# Patient Record
Sex: Female | Born: 1945 | Race: Black or African American | Hispanic: No | Marital: Single | Smoking: Never smoker
Health system: Southern US, Community
[De-identification: ages and names within clinical notes are randomized; demographics above are authoritative.]

## PROBLEM LIST (undated history)

## (undated) DIAGNOSIS — E785 Hyperlipidemia, unspecified: Secondary | ICD-10-CM

## (undated) DIAGNOSIS — R51 Headache: Secondary | ICD-10-CM

## (undated) DIAGNOSIS — I10 Essential (primary) hypertension: Secondary | ICD-10-CM

## (undated) DIAGNOSIS — R519 Headache, unspecified: Secondary | ICD-10-CM

## (undated) DIAGNOSIS — T7840XA Allergy, unspecified, initial encounter: Secondary | ICD-10-CM

## (undated) DIAGNOSIS — H3530 Unspecified macular degeneration: Secondary | ICD-10-CM

## (undated) DIAGNOSIS — K219 Gastro-esophageal reflux disease without esophagitis: Secondary | ICD-10-CM

## (undated) DIAGNOSIS — F329 Major depressive disorder, single episode, unspecified: Secondary | ICD-10-CM

## (undated) DIAGNOSIS — F419 Anxiety disorder, unspecified: Secondary | ICD-10-CM

## (undated) DIAGNOSIS — H269 Unspecified cataract: Secondary | ICD-10-CM

## (undated) DIAGNOSIS — M1990 Unspecified osteoarthritis, unspecified site: Secondary | ICD-10-CM

## (undated) DIAGNOSIS — F32A Depression, unspecified: Secondary | ICD-10-CM

## (undated) HISTORY — DX: Hyperlipidemia, unspecified: E78.5

## (undated) HISTORY — DX: Allergy, unspecified, initial encounter: T78.40XA

## (undated) HISTORY — DX: Unspecified cataract: H26.9

## (undated) HISTORY — PX: ABDOMINAL HYSTERECTOMY: SHX81

## (undated) HISTORY — PX: TUBAL LIGATION: SHX77

## (undated) HISTORY — PX: EYE SURGERY: SHX253

## (undated) HISTORY — DX: Anxiety disorder, unspecified: F41.9

## (undated) HISTORY — PX: KNEE ARTHROSCOPY: SUR90

## (undated) HISTORY — DX: Depression, unspecified: F32.A

## (undated) HISTORY — DX: Major depressive disorder, single episode, unspecified: F32.9

## (undated) HISTORY — PX: CHOLECYSTECTOMY: SHX55

---

## 1997-09-28 ENCOUNTER — Emergency Department (HOSPITAL_COMMUNITY): Admission: EM | Admit: 1997-09-28 | Discharge: 1997-09-28 | Payer: Self-pay

## 1997-12-18 ENCOUNTER — Emergency Department (HOSPITAL_COMMUNITY): Admission: EM | Admit: 1997-12-18 | Discharge: 1997-12-18 | Payer: Self-pay | Admitting: Emergency Medicine

## 1998-05-08 ENCOUNTER — Ambulatory Visit (HOSPITAL_COMMUNITY): Admission: RE | Admit: 1998-05-08 | Discharge: 1998-05-08 | Payer: Self-pay | Admitting: Cardiovascular Disease

## 1998-05-09 ENCOUNTER — Ambulatory Visit (HOSPITAL_COMMUNITY): Admission: RE | Admit: 1998-05-09 | Discharge: 1998-05-09 | Payer: Self-pay | Admitting: Cardiovascular Disease

## 1998-05-09 ENCOUNTER — Encounter: Payer: Self-pay | Admitting: Cardiovascular Disease

## 1998-12-21 ENCOUNTER — Emergency Department (HOSPITAL_COMMUNITY): Admission: EM | Admit: 1998-12-21 | Discharge: 1998-12-21 | Payer: Self-pay | Admitting: Emergency Medicine

## 1999-07-05 ENCOUNTER — Encounter: Admission: RE | Admit: 1999-07-05 | Discharge: 1999-07-05 | Payer: Self-pay | Admitting: Internal Medicine

## 1999-07-05 ENCOUNTER — Encounter: Payer: Self-pay | Admitting: Internal Medicine

## 2000-03-05 ENCOUNTER — Encounter: Payer: Self-pay | Admitting: Emergency Medicine

## 2000-03-05 ENCOUNTER — Emergency Department (HOSPITAL_COMMUNITY): Admission: EM | Admit: 2000-03-05 | Discharge: 2000-03-05 | Payer: Self-pay | Admitting: Emergency Medicine

## 2001-09-23 ENCOUNTER — Ambulatory Visit (HOSPITAL_BASED_OUTPATIENT_CLINIC_OR_DEPARTMENT_OTHER): Admission: RE | Admit: 2001-09-23 | Discharge: 2001-09-23 | Payer: Self-pay | Admitting: Orthopedic Surgery

## 2001-09-27 ENCOUNTER — Emergency Department (HOSPITAL_COMMUNITY): Admission: EM | Admit: 2001-09-27 | Discharge: 2001-09-27 | Payer: Self-pay | Admitting: Emergency Medicine

## 2002-07-28 ENCOUNTER — Encounter: Payer: Self-pay | Admitting: Occupational Medicine

## 2002-07-28 ENCOUNTER — Encounter: Admission: RE | Admit: 2002-07-28 | Discharge: 2002-07-28 | Payer: Self-pay | Admitting: Occupational Medicine

## 2002-08-12 ENCOUNTER — Encounter: Admission: RE | Admit: 2002-08-12 | Discharge: 2002-09-09 | Payer: Self-pay | Admitting: Occupational Medicine

## 2003-03-04 ENCOUNTER — Emergency Department (HOSPITAL_COMMUNITY): Admission: AD | Admit: 2003-03-04 | Discharge: 2003-03-04 | Payer: Self-pay | Admitting: Emergency Medicine

## 2003-07-10 ENCOUNTER — Emergency Department (HOSPITAL_COMMUNITY): Admission: AD | Admit: 2003-07-10 | Discharge: 2003-07-10 | Payer: Self-pay | Admitting: Family Medicine

## 2003-07-20 ENCOUNTER — Emergency Department (HOSPITAL_COMMUNITY): Admission: EM | Admit: 2003-07-20 | Discharge: 2003-07-20 | Payer: Self-pay | Admitting: Family Medicine

## 2004-07-23 ENCOUNTER — Ambulatory Visit: Payer: Self-pay | Admitting: Internal Medicine

## 2004-11-07 ENCOUNTER — Emergency Department (HOSPITAL_COMMUNITY): Admission: EM | Admit: 2004-11-07 | Discharge: 2004-11-07 | Payer: Self-pay | Admitting: Emergency Medicine

## 2004-11-29 ENCOUNTER — Ambulatory Visit: Payer: Self-pay | Admitting: Internal Medicine

## 2005-08-10 ENCOUNTER — Emergency Department (HOSPITAL_COMMUNITY): Admission: EM | Admit: 2005-08-10 | Discharge: 2005-08-11 | Payer: Self-pay | Admitting: Family Medicine

## 2005-08-15 ENCOUNTER — Ambulatory Visit: Payer: Self-pay | Admitting: Internal Medicine

## 2006-02-11 ENCOUNTER — Ambulatory Visit: Payer: Self-pay | Admitting: Internal Medicine

## 2007-01-30 ENCOUNTER — Encounter: Payer: Self-pay | Admitting: Internal Medicine

## 2007-11-27 ENCOUNTER — Ambulatory Visit: Payer: Self-pay | Admitting: Internal Medicine

## 2007-11-27 DIAGNOSIS — I10 Essential (primary) hypertension: Secondary | ICD-10-CM | POA: Insufficient documentation

## 2007-11-27 DIAGNOSIS — E119 Type 2 diabetes mellitus without complications: Secondary | ICD-10-CM

## 2007-11-27 DIAGNOSIS — F411 Generalized anxiety disorder: Secondary | ICD-10-CM | POA: Insufficient documentation

## 2007-11-27 LAB — CONVERTED CEMR LAB
AST: 14 units/L (ref 0–37)
Albumin: 4.3 g/dL (ref 3.5–5.2)
Alkaline Phosphatase: 72 units/L (ref 39–117)
Basophils Relative: 1 % (ref 0–1)
Bilirubin, Direct: <0.1 mg/dL (ref 0.0–0.3)
Blood Glucose, Fingerstick: 155
Calcium: 10 mg/dL (ref 8.4–10.5)
Eosinophils Absolute: 0.5 10*3/uL (ref 0.0–0.7)
Eosinophils Relative: 7 % — ABNORMAL HIGH (ref 0–5)
Glucose, Bld: 153 mg/dL — ABNORMAL HIGH (ref 70–99)
HCT: 40.3 % (ref 36.0–46.0)
Hemoglobin: 12.9 g/dL (ref 12.0–15.0)
Hgb A1c MFr Bld: 6.9 % — ABNORMAL HIGH (ref 4.6–6.1)
Lymphocytes Relative: 34 % (ref 12–46)
MCHC: 32 g/dL (ref 30.0–36.0)
Monocytes Absolute: 0.4 10*3/uL (ref 0.1–1.0)
Neutro Abs: 3.3 10*3/uL (ref 1.7–7.7)
RDW: 14.2 % (ref 11.5–15.5)
Sodium: 141 meq/L (ref 135–145)
Total Protein: 7.5 g/dL (ref 6.0–8.3)

## 2008-01-27 ENCOUNTER — Ambulatory Visit: Payer: Self-pay | Admitting: Internal Medicine

## 2008-01-27 DIAGNOSIS — R1310 Dysphagia, unspecified: Secondary | ICD-10-CM | POA: Insufficient documentation

## 2008-01-27 LAB — CONVERTED CEMR LAB: Blood Glucose, Fingerstick: 159

## 2008-05-02 ENCOUNTER — Ambulatory Visit: Payer: Self-pay | Admitting: Internal Medicine

## 2008-05-02 DIAGNOSIS — K219 Gastro-esophageal reflux disease without esophagitis: Secondary | ICD-10-CM | POA: Insufficient documentation

## 2008-05-02 LAB — CONVERTED CEMR LAB: Hgb A1c MFr Bld: 7.3 % — ABNORMAL HIGH (ref 4.6–6.0)

## 2008-09-21 ENCOUNTER — Encounter (INDEPENDENT_AMBULATORY_CARE_PROVIDER_SITE_OTHER): Payer: Self-pay | Admitting: *Deleted

## 2010-09-07 NOTE — Assessment & Plan Note (Signed)
[facility]                                 ON-CALL NOTE   NAME:Alison [name], Alison [name]                          MRN:          [id number]  DATE:12/13/2006                            DOB:          [birth year]    PHONE NUMBER:  [phone]   PRIMARY CARE PHYSICIAN:  Gordy Savers, MD   TIME OF CALL:  8:01 a.m.  on August 23, because of recurring vertigo.   She has had vertigo in the past and took Antivert by prescription and  also some other medication that she is not sure of and asked if I can  phone those in for her. She tells me she has no other symptoms  suggestive of stroke like weakness or problems with speech. She is only  just about 89, which makes stroke unlikely and she has had the vertigo  before. I told her that evaluation may be necessary if she is not  feeling better, but I did tell her that she can get the meclizine  without a prescription and advised that she try that. If she is not  getting better, I did tell her that we could see her in the office later  this morning.     Karie Schwalbe, MD  Electronically Signed    RIL/MedQ  DD: 12/13/2006  DT: 12/14/2006  Job #: [id number]   cc:   Gordy Savers, MD

## 2010-09-07 NOTE — Op Note (Signed)
[place]  Patient:    Alison [name], Alison [name] Visit Number: [id number] MRN: [id number]          Service Type: DSU Location: [facility] Attending Physician:  Colbert Ewing Dictated by:   Loreta Ave, M.D. Proc. 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  PROCEDURE:  Left knee examination under anesthesia, arthroscopy, with chondroplasty of the medial femoral condyle, partial medial meniscectomy, and excision of medial plica.  SURGEON:  Loreta Ave, M.D.  ASSISTANT:  Arlys John D. Petrarca, P.A.-C.  ANESTHESIA:  General.  ESTIMATED BLOOD LOSS:  Minimal.  TOURNIQUET TIME:  Not employed.  SPECIMENS:  None.  CULTURES:  None.  COMPLICATIONS:  None.  DRESSING:  Soft compressive.  DESCRIPTION OF PROCEDURE: The patient was brought to the operating room and placed in the supine position.  After adequate anesthesia had been obtained, left knee examined.  Full motion and good stability, reasonable patellofemoral tracking.  Tourniquet and leg holder applied, leg prepped and draped in the usual sterile fashion.  Three portals created, one superolateral, one each medial and lateral parapatellar.  Inflow catheter introduced, the knee distended, arthroscope introduced, knee inspected.  The patellofemoral joint was intact.  Reactive synovitis.  Good tracking.  Did not significantly tether.  Stout medial plica with abrasive changes on the condyle.  Plica resected, hemostasis with cautery.  Medial compartment revealed a detached posterior horn tear of medial meniscus, which was saucerized out, tapered in smoothly.  Flap tear, anterior horn,  saucerized out and tapered in.  Retained a fair amount of meniscus behind.  Diffuse grade 3 relatively superficial changes, weightbearing dome medial femoral condyle, treated with chondroplasty and removal of all flaps.  The plateau normal.  Little grade 2 changes lateral tibial plateau, debrided as well.  Lateral meniscus without tears.  Cruciate ligaments intact.  At completion, hemostasis was obtained with cautery.  The entire knee examined and no other significant findings appreciated. Instruments and fluid removed.  Portals and the knee injected with Marcaine. Portals closed with 4-0 nylon.  Sterile compressive dressing applied. Anesthesia reversed.  Brought to the recovery room.  Tolerated surgery well, no complications. Dictated by:   Loreta Ave, M.D. Attending Physician:  Colbert Ewing DD:  09/23/01 TD:  09/25/01 Job: [id number]

## 2011-06-29 ENCOUNTER — Observation Stay (HOSPITAL_COMMUNITY)
Admission: EM | Admit: 2011-06-29 | Discharge: 2011-06-29 | Disposition: A | Discharge status: Home / Self Care | Payer: Self-pay | Attending: Emergency Medicine | Admitting: Emergency Medicine

## 2011-06-29 ENCOUNTER — Emergency Department (INDEPENDENT_AMBULATORY_CARE_PROVIDER_SITE_OTHER)
Admission: EM | Admit: 2011-06-29 | Discharge: 2011-06-29 | Disposition: A | Discharge status: Nursing Home (Includes ICF) | Payer: Self-pay | Source: Home / Self Care | Attending: Emergency Medicine | Admitting: Emergency Medicine

## 2011-06-29 ENCOUNTER — Encounter (HOSPITAL_COMMUNITY): Payer: Self-pay

## 2011-06-29 DIAGNOSIS — Y92009 Unspecified place in unspecified non-institutional (private) residence as the place of occurrence of the external cause: Secondary | ICD-10-CM | POA: Insufficient documentation

## 2011-06-29 DIAGNOSIS — X58XXXA Exposure to other specified factors, initial encounter: Secondary | ICD-10-CM | POA: Insufficient documentation

## 2011-06-29 DIAGNOSIS — E876 Hypokalemia: Secondary | ICD-10-CM | POA: Insufficient documentation

## 2011-06-29 DIAGNOSIS — I10 Essential (primary) hypertension: Secondary | ICD-10-CM | POA: Insufficient documentation

## 2011-06-29 DIAGNOSIS — E119 Type 2 diabetes mellitus without complications: Secondary | ICD-10-CM | POA: Insufficient documentation

## 2011-06-29 DIAGNOSIS — Z79899 Other long term (current) drug therapy: Secondary | ICD-10-CM | POA: Insufficient documentation

## 2011-06-29 DIAGNOSIS — Z7982 Long term (current) use of aspirin: Secondary | ICD-10-CM | POA: Insufficient documentation

## 2011-06-29 DIAGNOSIS — R739 Hyperglycemia, unspecified: Secondary | ICD-10-CM

## 2011-06-29 DIAGNOSIS — Y998 Other external cause status: Secondary | ICD-10-CM | POA: Insufficient documentation

## 2011-06-29 DIAGNOSIS — T783XXA Angioneurotic edema, initial encounter: Secondary | ICD-10-CM

## 2011-06-29 HISTORY — DX: Essential (primary) hypertension: I10

## 2011-06-29 LAB — POCT I-STAT, CHEM 8
Chloride: 101 mEq/L (ref 96–112)
Creatinine, Ser: 0.7 mg/dL (ref 0.50–1.10)
Hemoglobin: 13.6 g/dL (ref 12.0–15.0)

## 2011-06-29 LAB — DIFFERENTIAL
Basophils Absolute: 0 10*3/uL (ref 0.0–0.1)
Basophils Relative: 0 % (ref 0–1)
Eosinophils Relative: 0 % (ref 0–5)
Lymphs Abs: 0.6 10*3/uL — ABNORMAL LOW (ref 0.7–4.0)
Monocytes Relative: 2 % — ABNORMAL LOW (ref 3–12)
Neutro Abs: 8.9 10*3/uL — ABNORMAL HIGH (ref 1.7–7.7)

## 2011-06-29 LAB — CBC: HCT: 38.4 % (ref 36.0–46.0)

## 2011-06-29 MED ORDER — EPINEPHRINE HCL 1 MG/ML IJ SOLN
INTRAMUSCULAR | Status: AC
  Filled 2011-06-29: qty 1

## 2011-06-29 MED ORDER — DIPHENHYDRAMINE HCL 50 MG/ML IJ SOLN
25.0000 mg | Freq: Once | INTRAMUSCULAR | Status: AC
  Administered 2011-06-29: 25 mg via INTRAVENOUS

## 2011-06-29 MED ORDER — PREDNISONE 20 MG PO TABS: 40.0000 mg | ORAL_TABLET | Freq: Every day | ORAL | Status: AC

## 2011-06-29 MED ORDER — POTASSIUM CHLORIDE CRYS ER 20 MEQ PO TBCR
40.0000 meq | EXTENDED_RELEASE_TABLET | Freq: Once | ORAL | Status: AC
  Administered 2011-06-29: 40 meq via ORAL
  Filled 2011-06-29: qty 2

## 2011-06-29 MED ORDER — POTASSIUM CHLORIDE 10 MEQ/100ML IV SOLN
10.0000 meq | Freq: Once | INTRAVENOUS | Status: AC
  Administered 2011-06-29: 10 meq via INTRAVENOUS
  Filled 2011-06-29: qty 100

## 2011-06-29 MED ORDER — EPINEPHRINE 0.3 MG/0.3ML IJ DEVI: 0.3000 mg | INTRAMUSCULAR | Status: DC | PRN

## 2011-06-29 MED ORDER — EPINEPHRINE HCL 1 MG/ML IJ SOLN
0.3000 mL | Freq: Once | INTRAMUSCULAR | Status: AC
  Administered 2011-06-29: 0.3 mg via INTRAMUSCULAR

## 2011-06-29 MED ORDER — DIPHENHYDRAMINE HCL 50 MG/ML IJ SOLN
INTRAMUSCULAR | Status: AC
  Filled 2011-06-29: qty 1

## 2011-06-29 MED ORDER — SODIUM CHLORIDE 0.9 % IV SOLN: Freq: Once | INTRAVENOUS | Status: DC

## 2011-06-29 MED ORDER — SODIUM CHLORIDE 0.9 % IV SOLN
INTRAVENOUS | Status: DC
  Administered 2011-06-29: 10:00:00 via INTRAVENOUS

## 2011-06-29 MED ORDER — METHYLPREDNISOLONE SODIUM SUCC 125 MG IJ SOLR
125.0000 mg | Freq: Once | INTRAMUSCULAR | Status: AC
  Administered 2011-06-29: 125 mg via INTRAVENOUS

## 2011-06-29 MED ORDER — METHYLPREDNISOLONE SODIUM SUCC 125 MG IJ SOLR
INTRAMUSCULAR | Status: AC
  Filled 2011-06-29: qty 2

## 2011-06-29 MED ORDER — FAMOTIDINE IN NACL 20-0.9 MG/50ML-% IV SOLN
20.0000 mg | Freq: Once | INTRAVENOUS | Status: AC
  Administered 2011-06-29: 20 mg via INTRAVENOUS
  Filled 2011-06-29: qty 50

## 2011-06-29 NOTE — ED Notes (Signed)
Pt presents to ED from [facility] via carelink, pt reports taking a BC powder last night and woke this am w/swelling to her tongue, pt denies taking any ace-inhibitors, VSS

## 2011-06-29 NOTE — ED Notes (Signed)
Dr. Ranae Palms at pt bedside a this time.

## 2011-06-29 NOTE — ED Notes (Signed)
Clear liquid diet ordered as per Grant Fontana, PA

## 2011-06-29 NOTE — ED Provider Notes (Signed)
1:44 PM Care assumed of the patient in the CDU from [name], PA-C. Patient on allergic reaction protocol; she apparently awoke with swelling to her tongue which started overnight while sleeping. Denies shortness of breath, difficulty breathing. Initially presented to urgent care and received Solu-Medrol, Benadryl, epinephrine en route. Has had slow subjective improvement. She's been moved to the CDU for further observation.   On exam, pt with swelling isolated to tongue. Pt speaking well and enunciating with very slightly muffled voice. Basic metabolic panel shows that her potassium is 2.7. As she still has swelling, do not feel that she is able to tolerate by mouth potassium at this time. Will replete via IV. Will continue to monitor.  3:18 PM Signout given to [place], PA-C who will disposition as appropriate.  Grant Fontana, [place] 06/29/11 [phone]

## 2011-06-29 NOTE — Discharge Instructions (Signed)
Please read and follow all provided instructions.  Your diagnoses today include:  1. Angioedema   2. Hyperglycemia     Tests performed today include:  Vital signs. See below for your results today.   Medications prescribed:   Benadryl (diphenhydramine) - antihistamine that blocks allergic reaction  Pepcid (famotidine) - antihistamine that blocks allergic reaction  You can find the antihistamines listed above over-the-counter. Take these for 3 days.  DO NOT exceed:   50mg  Benadryl every 6 hours  20mg  Pepcid every 12 hours   Benadryl will make you drowsy. DO NOT drive or perform any activities that require you to be awake and alert if taking this. Use Pepcid or Zyrtec if performing these activities.    Prednisone - steroid medicine that blocks allergic reaction  Please take the prednisone as prescribed for 5 days. It is best to take this medication in the morning to prevent sleeping problems. If you are diabetic, monitor your blood sugar closely and stop taking Prednisone if blood sugar is over 350. Take with food to prevent stomach upset.    Epi-pen - inject into thigh as directed if you have a severe reaction that causes throat swelling or any trouble breathing. Call 9-1-1 if you use an Epi-pen. You should be evaluated at a hospital as soon as possible.    Take any prescribed medications only as directed.  Home care instructions:   Follow any educational materials contained in this packet  Follow-up instructions: Please follow-up with your primary care provider in the next 3 days for further evaluation of your symptoms. If you do not have a primary care doctor -- see below for referral information.   Return instructions:   Please return to the Emergency Department if you experience worsening symptoms.   Call 9-1-1 immediately if you have an allergic reaction that involves your lips, mouth, throat or if you have any difficulty breathing. This is a life-threatening  emergency.   Please return if you have any other emergent concerns.  Additional Information:  Your vital signs today were: BP 124/109  Pulse 101  Temp(Src) 97.9 F (36.6 C) (Oral)  Resp 18  SpO2 98% If your blood pressure (BP) was elevated above 135/85 this visit, please have this repeated by your doctor within one month. -------------- No Primary Care Doctor Call [facility]  [phone] Other agencies that provide inexpensive medical care    [facility]  [phone]    [facility]  [phone]    [facility]  [phone]    [facility]  [phone]    [facility]  [phone]    [facility]  [phone] -------------- RESOURCE GUIDE:  Dental Problems  Patients with Medicaid: [facility] [phone] [address]                                            [phone] [address] Phone:  [phone]                                                   Phone:  [phone]  If unable to pay or uninsured, contact:  [facility] or [facility] to become qualified for the adult dental clinic.  Chronic Pain Problems Contact [facility]  [phone] Patients need to be referred by their primary care doctor.  Insufficient Money for Medicine Contact [employer]:  call "211" or [facility] [phone].  Psychological Services [facility] Behavioral [facility]  [phone] [facility]  [phone] [facility] Mental Health   [phone] (emergency services [phone])  Substance Abuse Resources [facility]  5390920253 [facility] [phone] [facility] [place] [phone] Floydene Flock [phone] Residential & Outpatient Substance Abuse Program  6310028703  Abuse/Neglect [facility] Child Abuse Hotline 5183715485 [facility] Child Abuse Hotline (313)[phone] (After Hours)  Emergency Shelter [facility]  Ministries [phone]  Maternity Homes Room at the [place] [facility] [phone] [place] [facility] [phone]  [facility] Resources  [facility]     [employer]                          [facility] Dept. [address] [place]                       [address]      [address]  Blondell Reveal Phone:  [phone]                                   Phone:  [phone]                 Phone:  [phone]  [facility] Mental Health Phone:  [phone]  [facility] Child Abuse Hotline [phone] 5158001995 (After Hours)

## 2011-06-29 NOTE — ED Provider Notes (Signed)
Chief Complaint  Patient presents with  . Oral Swelling    History of Present Illness:   Mrs. Pucillo workup this morning with swelling of her tongue, floor the mouth, and neck. This was slightly painful. She denies any difficulty breathing. She denies any swelling of her throat, wheezing, or cough. She took a BC powder last night going to bed and this was her only new medication. All of her other meds she's been on for months or longer. She's not taking an ACE inhibitor. She has had similar episodes in the past but not quite as severe. These were managed as an outpatient. She is allergic to codeine and penicillins, but does not think she is ingested either of these substances recently.  Review of Systems:  Other than noted above, the patient denies any of the following symptoms. Systemic:  No fever, chills, sweats, fatigue, myalgias, headache, or anorexia. Eye:  No redness, pain or drainage. ENT:  No earache, nasal congestion, rhinorrhea, sinus pressure, or sore throat. Lungs:  No cough, sputum production, wheezing, shortness of breath. Or chest pain. GI:  No nausea, vomiting, abdominal pain or diarrhea. Skin:  No rash or itching.  PMFSH:  Past medical history, family history, social history, meds, and allergies were reviewed.  Physical Exam:   Vital signs:  BP 151/84  Pulse 100  Temp(Src) 98.7 F (37.1 C) (Oral)  Resp 22  SpO2 100% General:  Alert, in no distress. She is in no respiratory distress. She does have some difficulty speaking due to the swelling of her tongue. Eye:  No conjunctival injection or drainage. ENT:  TMs and canals were normal, without erythema or inflammation.  Nasal mucosa was clear and uncongested, without drainage.  Mucous membranes were moist.  Pharynx was clear, without exudate or drainage.  There were no oral ulcerations or lesions. She has marked swelling of the tongue, floor the mouth, and the submandibular area of the neck. This was mildly tender to palpation.  The cervix itself is clear. Neck:  Supple, no adenopathy, tenderness or mass. Lungs:  No respiratory distress.  Lungs were clear to auscultation, without wheezes, rales or rhonchi.  Breath sounds were clear and equal bilaterally. Heart:  Regular rhythm, without gallops, murmers or rubs. Skin:  Clear, warm, and dry, without rash or lesions.  Course in Urgent Care Center:   An IV was started with normal saline at 50 mL per hour. She was given epinephrine 0.3 mL IM, Solu-Medrol 125 mg IV, and diphenhydramine 25 mg IV. During her stay here she did not develop any progressive respiratory distress and she was transferred by a care Link to the emergency department without any serious respiratory problems. She was given nasal oxygen at 2 L per minute.  Assessment:   Diagnoses that have been ruled out:  None  Diagnoses that are still under consideration:  None  Final diagnoses:  Angioedema      Plan:   1.  The patient was transferred by care Link to the emergency department.  Reuben Likes, MD 06/29/11 [phone]

## 2011-06-29 NOTE — ED Provider Notes (Signed)
History     CSN: [id number]  Arrival date & time 06/29/11  1040   First MD Initiated Contact with Patient 06/29/11 1045      Chief Complaint  Patient presents with  . Allergic Reaction    (Consider location/radiation/quality/duration/timing/severity/associated sxs/prior treatment) Patient is a 66 y.o. female presenting with allergic reaction. The history is provided by the patient and the EMS personnel.  Allergic Reaction The primary symptoms are  angioedema. The primary symptoms do not include shortness of breath or rash. The current episode started 6 to 12 hours ago.  The angioedema is not associated with shortness of breath.  Associated symptoms comments: Swelling isolated to tongue that started overnight while sleeping. She reports similar symptoms one month ago that was treated with steroid taper and resolved. No SOB, difficulty breathing. She received Solumedrol, Benadryl and EpiPen in route by EMS and reports she feels symptoms are improving..    Past Medical History  Diagnosis Date  . Hypertension   . Diabetes mellitus     Past Surgical History  Procedure Date  . Abdominal hysterectomy   . Cholecystectomy     No family history on file.  History  Substance Use Topics  . Smoking status: Never Smoker   . Smokeless tobacco: Not on file  . Alcohol Use: No    OB History    Grav Para Term Preterm Abortions TAB SAB Ect Mult Living                  Review of Systems  Constitutional: Negative for fever and chills.  HENT:       Tongue swelling.  Respiratory: Negative.  Negative for shortness of breath.   Cardiovascular: Negative.   Gastrointestinal: Negative.   Musculoskeletal: Negative.   Skin: Negative.  Negative for rash.  Neurological: Negative.     Allergies  Codeine and Penicillins  Home Medications   Current Outpatient Rx  Name Route Sig Dispense Refill  . METFORMIN HCL 1000 MG PO TABS Oral Take 1,000 mg by mouth 2 (two) times daily with a meal.     . METOPROLOL SUCCINATE ER 25 MG PO TB24 Oral Take 25 mg by mouth daily.    Marland Kitchen OVER THE COUNTER MEDICATION  bc powder    . PAROXETINE HCL 20 MG PO TABS Oral Take 20 mg by mouth every morning.    . TRIAMTERENE-HCTZ 37.5-25 MG PO TABS Oral Take 1 tablet by mouth daily.      BP 166/64  Pulse 101  Temp(Src) 98.2 F (36.8 C) (Oral)  Resp 20  SpO2 100%  Physical Exam  Constitutional: She appears well-developed and well-nourished.  HENT:  Head: Normocephalic.       Tongue moderately swollen. Oropharynx benign. No thyromegaly.  Neck: Normal range of motion. Neck supple.  Cardiovascular: Normal rate and regular rhythm.   Pulmonary/Chest: Effort normal and breath sounds normal. No respiratory distress. She has no wheezes.       No stridor.  Abdominal: Soft. Bowel sounds are normal. There is no tenderness. There is no rebound and no guarding.  Musculoskeletal: Normal range of motion.  Neurological: She is alert. No cranial nerve deficit.  Skin: Skin is warm and dry. No rash noted.  Psychiatric: She has a normal mood and affect.    ED Course  Procedures (including critical care time)  Labs Reviewed - No data to display No results found. Patient continues to improve, slowly, over time.   No diagnosis found.    MDM  Patient moved to CDU on Allergic reaction protocol for observation over longer period of time. Anticipate discharge home as she continues to feel better with very slow objective improvement.   Rodena Medin, PA-C 06/29/11 1311

## 2011-06-29 NOTE — ED Notes (Signed)
Rhonda at [facility] has a truck on the way

## 2011-06-29 NOTE — ED Provider Notes (Signed)
History     CSN: [id number]  Arrival date & time 06/29/11  1040   First MD Initiated Contact with Patient 06/29/11 1045      Chief Complaint  Patient presents with  . Allergic Reaction    (Consider location/radiation/quality/duration/timing/severity/associated sxs/prior treatment) HPI  Past Medical History  Diagnosis Date  . Hypertension   . Diabetes mellitus     Past Surgical History  Procedure Date  . Abdominal hysterectomy   . Cholecystectomy     No family history on file.  History  Substance Use Topics  . Smoking status: Never Smoker   . Smokeless tobacco: Not on file  . Alcohol Use: No    OB History    Grav Para Term Preterm Abortions TAB SAB Ect Mult Living                  Review of Systems  Allergies  Codeine and Penicillins  Home Medications   Current Outpatient Rx  Name Route Sig Dispense Refill  . ASPIRIN EC 81 MG PO TBEC Oral Take 81 mg by mouth daily.    Marland Kitchen METFORMIN HCL 1000 MG PO TABS Oral Take 1,000 mg by mouth 2 (two) times daily with a meal.    . METOPROLOL SUCCINATE ER 25 MG PO TB24 Oral Take 25 mg by mouth daily.    Marland Kitchen OVER THE COUNTER MEDICATION  bc powder    . PAROXETINE HCL 20 MG PO TABS Oral Take 20 mg by mouth every morning.    . TRIAMTERENE-HCTZ 37.5-25 MG PO TABS Oral Take 1 tablet by mouth daily.      BP 124/71  Pulse 105  Temp(Src) 97.9 F (36.6 C) (Oral)  Resp 18  SpO2 99%  Physical Exam  ED Course  Procedures (including critical care time)  Labs Reviewed  DIFFERENTIAL - Abnormal; Notable for the following:    Neutrophils Relative 92 (*)    Neutro Abs 8.9 (*)    Lymphocytes Relative 6 (*)    Lymphs Abs 0.6 (*)    Monocytes Relative 2 (*)    All other components within normal limits  GLUCOSE, CAPILLARY - Abnormal; Notable for the following:    Glucose-Capillary 212 (*)    All other components within normal limits  CBC   No results found.   1. Angioedema   2. Hyperglycemia     3:31 PM Patient with  angioedema that started this morning. Patient was seen at urgent care and given epinephrine at approximately 10 AM. She is currently in the CDU on the allergic reaction protocol after receiving steroids, Benadryl, and famotidine. She had a similar reaction last month that was treated with steroids. She is not currently on any ACE inhibitors.  Patient was seen and examined. She states subjective improvement in the swelling of her tongue. She is eating in room. Hypokalemia noted on exam and patient has received potassium. Will give additional by mouth potassium.  Plan: Will reexamined at 1630. At that point the patient will have received epinephrine 6-1/2 hours prior. If continuing to improve or stable will discharge to home. If worsening will continue to monitor.  3:34 PM Exam:  Gen NAD; Mouth trace edema of tongue, no uvula swelling; Heart RRR, nml S1,S2, no m/r/g; Lungs CTAB, no stridor or increased work of breathing; Abd soft, NT, no rebound or guarding; Ext 2+ pedal pulses bilaterally, no edema.  4:33 PM patient continues to have improvement. She has taken of potassium without difficulty. Will discharge to  home on prednisone. Patient is a diabetic. Urged patient to monitor her blood sugars closely and not to take prednisone if blood sugar is over 350. Also will prescribe epinephrine pen and I instructed the patient on its use. Urged primary care followup in the next week to discuss possible causes of allergic reaction. Urged patient to return immediately if her tongue, mouth swelling worsens or if she has shortness of breath. Patient verbalizes understanding and agrees with plan.  Patient was discussed with Dr. Ranae Palms prior to discharge.   MDM  Angioedema, clinically improved in emergency department. Patient has been monitored for greater than 4 hours. No concern for biphasic component to allergic reaction. Patient appears well, will discharge home.        Renne Crigler, [place] 06/29/11  1640

## 2011-06-29 NOTE — ED Notes (Signed)
Pt brought directly to room 2, swelling of neck and tongue, started last pm and has gotten worse, denies new meds or foods, denies sob.  Pt unable to talk clearly.

## 2011-06-29 NOTE — ED Notes (Signed)
Pt received Solumedrol 125 mg, Benadryl 25 mg, and Epinephrine 0.3 mg while at [facility]

## 2011-06-29 NOTE — Discharge Instructions (Signed)
We have determined that your problem requires further evaluation in the emergency department.  We will take care of your transport there.  Once at the emergency department, you will be evaluated by a provider and they will order whatever treatment or tests they deem necessary.  We cannot guarantee that they will do any specific test or do any specific treatment.  ° °

## 2011-06-30 NOTE — ED Provider Notes (Signed)
Medical screening examination/treatment/procedure(s) were conducted as a shared visit with non-physician practitioner(s) and myself.  I personally evaluated the patient during the encounter   Loren Racer, MD 06/30/11 1534

## 2011-06-30 NOTE — ED Provider Notes (Signed)
Medical screening examination/treatment/procedure(s) were conducted as a shared visit with non-physician practitioner(s) and myself.  I personally evaluated the patient during the encounter   Loren Racer, MD 06/30/11 1537

## 2011-06-30 NOTE — ED Provider Notes (Signed)
Medical screening examination/treatment/procedure(s) were performed by non-physician practitioner and as supervising physician I was immediately available for consultation/collaboration.   Dione Booze, MD 06/30/11 [phone]

## 2012-01-31 ENCOUNTER — Emergency Department (HOSPITAL_COMMUNITY)
Admission: EM | Admit: 2012-01-31 | Discharge: 2012-01-31 | Disposition: A | Discharge status: Home / Self Care | Payer: Medicare Other | Attending: Emergency Medicine | Admitting: Emergency Medicine

## 2012-01-31 ENCOUNTER — Emergency Department (INDEPENDENT_AMBULATORY_CARE_PROVIDER_SITE_OTHER)
Admission: EM | Admit: 2012-01-31 | Discharge: 2012-01-31 | Disposition: A | Discharge status: Other Acute Inpatient Hospital | Payer: Medicare Other | Source: Home / Self Care | Attending: Family Medicine | Admitting: Family Medicine

## 2012-01-31 ENCOUNTER — Encounter (HOSPITAL_COMMUNITY): Payer: Self-pay | Admitting: Emergency Medicine

## 2012-01-31 ENCOUNTER — Encounter (HOSPITAL_COMMUNITY): Payer: Self-pay

## 2012-01-31 DIAGNOSIS — Z79899 Other long term (current) drug therapy: Secondary | ICD-10-CM | POA: Insufficient documentation

## 2012-01-31 DIAGNOSIS — T783XXA Angioneurotic edema, initial encounter: Secondary | ICD-10-CM

## 2012-01-31 DIAGNOSIS — X58XXXA Exposure to other specified factors, initial encounter: Secondary | ICD-10-CM | POA: Insufficient documentation

## 2012-01-31 DIAGNOSIS — I10 Essential (primary) hypertension: Secondary | ICD-10-CM | POA: Insufficient documentation

## 2012-01-31 DIAGNOSIS — Z7982 Long term (current) use of aspirin: Secondary | ICD-10-CM | POA: Insufficient documentation

## 2012-01-31 DIAGNOSIS — E119 Type 2 diabetes mellitus without complications: Secondary | ICD-10-CM | POA: Insufficient documentation

## 2012-01-31 MED ORDER — DIPHENHYDRAMINE HCL 50 MG/ML IJ SOLN
INTRAMUSCULAR | Status: AC
  Filled 2012-01-31: qty 1

## 2012-01-31 MED ORDER — SODIUM CHLORIDE 0.9 % IV SOLN
Freq: Once | INTRAVENOUS | Status: AC
  Administered 2012-01-31: 11:00:00 via INTRAVENOUS

## 2012-01-31 MED ORDER — METOPROLOL TARTRATE 25 MG PO TABS
25.0000 mg | ORAL_TABLET | Freq: Once | ORAL | Status: AC
  Administered 2012-01-31: 25 mg via ORAL
  Filled 2012-01-31: qty 1

## 2012-01-31 MED ORDER — FAMOTIDINE IN NACL 20-0.9 MG/50ML-% IV SOLN
20.0000 mg | Freq: Once | INTRAVENOUS | Status: AC
  Administered 2012-01-31: 20 mg via INTRAVENOUS
  Filled 2012-01-31: qty 50

## 2012-01-31 MED ORDER — ASPIRIN 81 MG PO CHEW
81.0000 mg | CHEWABLE_TABLET | Freq: Once | ORAL | Status: AC
  Administered 2012-01-31: 81 mg via ORAL
  Filled 2012-01-31: qty 1

## 2012-01-31 MED ORDER — METHYLPREDNISOLONE SODIUM SUCC 125 MG IJ SOLR
125.0000 mg | Freq: Once | INTRAMUSCULAR | Status: AC
  Administered 2012-01-31: 125 mg via INTRAMUSCULAR

## 2012-01-31 MED ORDER — DIPHENHYDRAMINE HCL 50 MG/ML IJ SOLN
25.0000 mg | Freq: Once | INTRAMUSCULAR | Status: AC
  Administered 2012-01-31: 25 mg via INTRAVENOUS
  Filled 2012-01-31: qty 1

## 2012-01-31 MED ORDER — PREDNISONE (PAK) 10 MG PO TABS: 10.0000 mg | ORAL_TABLET | Freq: Every day | ORAL | Status: DC

## 2012-01-31 MED ORDER — DIPHENHYDRAMINE HCL 50 MG/ML IJ SOLN
50.0000 mg | Freq: Once | INTRAMUSCULAR | Status: AC
  Administered 2012-01-31: 50 mg via INTRAMUSCULAR

## 2012-01-31 MED ORDER — PAROXETINE HCL 20 MG PO TABS
20.0000 mg | ORAL_TABLET | Freq: Every day | ORAL | Status: DC
  Administered 2012-01-31: 20 mg via ORAL
  Filled 2012-01-31 (×2): qty 1

## 2012-01-31 MED ORDER — DIPHENHYDRAMINE HCL 25 MG PO TABS: 25.0000 mg | ORAL_TABLET | Freq: Four times a day (QID) | ORAL | Status: DC

## 2012-01-31 MED ORDER — METFORMIN HCL 500 MG PO TABS
500.0000 mg | ORAL_TABLET | Freq: Once | ORAL | Status: AC
  Administered 2012-01-31: 500 mg via ORAL
  Filled 2012-01-31: qty 1

## 2012-01-31 MED ORDER — EPINEPHRINE HCL 1 MG/ML IJ SOLN
INTRAMUSCULAR | Status: AC
  Filled 2012-01-31: qty 1

## 2012-01-31 MED ORDER — METHYLPREDNISOLONE SODIUM SUCC 125 MG IJ SOLR
INTRAMUSCULAR | Status: AC
  Filled 2012-01-31: qty 2

## 2012-01-31 MED ORDER — FAMOTIDINE 20 MG PO TABS: 20.0000 mg | ORAL_TABLET | Freq: Two times a day (BID) | ORAL | Status: DC

## 2012-01-31 MED ORDER — EPINEPHRINE HCL 1 MG/ML IJ SOLN
0.3000 mL | Freq: Once | INTRAMUSCULAR | Status: AC
  Administered 2012-01-31: 0.3 mg via INTRAMUSCULAR

## 2012-01-31 NOTE — ED Provider Notes (Signed)
Patient care assumed from [facility], PA-C. Patient being monitored for complaint of angioedema. Patient reassessed and states that she feels better. Airway not compromised, uvula midline, and lungs CTA. Patient will continue to be monitored. Patient care signed out to [facility], [place].  Pixie Casino, PA-C 01/31/12 1545

## 2012-01-31 NOTE — ED Provider Notes (Signed)
Medical screening examination/treatment/procedure(s) were conducted as a shared visit with non-physician practitioner(s) and myself.  I personally evaluated the patient during the encounter   Pt seen at urgent care, sent here for allergic angioedema presumably.  Pt improving here after further benadryl.  Placed on CDU allergic reaction protocol.  If continues to improve, safe to discharge to home with PCP follow up.  streoid Rx.  Gavin Pound. Oletta Lamas, MD 01/31/12 1191

## 2012-01-31 NOTE — ED Notes (Signed)
Pt discharged.Vital signs stable and GCS 15 

## 2012-01-31 NOTE — ED Provider Notes (Signed)
Medical screening examination/treatment/procedure(s) were conducted as a shared visit with non-physician practitioner(s) and myself.  I personally evaluated the patient during the encounter    Alison [name]. Alison Lamas, MD 01/31/12 4098

## 2012-01-31 NOTE — ED Notes (Signed)
EMS called to transport pt, ED advised  @ 11:25; EMS arrived 11:30

## 2012-01-31 NOTE — ED Notes (Signed)
Pt transported to CDU 3, reports swelling to tongue is "better", pt is able to swallow without difficulty, Dr. Oletta Lamas at bedside.

## 2012-01-31 NOTE — ED Notes (Signed)
Diabetic meal tray ordered for pt at this time.

## 2012-01-31 NOTE — ED Provider Notes (Signed)
History     CSN: [id number]  Arrival date & time 01/31/12  1147   First MD Initiated Contact with Patient 01/31/12 1213      Chief Complaint  Patient presents with  . Allergic Reaction    (Consider location/radiation/quality/duration/timing/severity/associated sxs/prior treatment) HPI Patient presents emergency department with angioedema of her tongue.  The patient, states, that she started having this problem earlier this morning and she felt that her tongue was swollen and she was having difficulty speaking.  The patient, states, that she did not eat anything new or different today.  She denies any new exposures to chemicals or medication.  Patient has chest pain, shortness of breath, nausea, vomiting, headache, back pain, blurred vision, syncope, or wheezing.  Patient, states she did not take anything prior to arrival, for the symptoms.  Patient was seen at the urgent care center and given epinephrine and Decadron and Benadryl.  Past Medical History  Diagnosis Date  . Hypertension   . Diabetes mellitus     Past Surgical History  Procedure Date  . Abdominal hysterectomy   . Cholecystectomy     History reviewed. No pertinent family history.  History  Substance Use Topics  . Smoking status: Never Smoker   . Smokeless tobacco: Not on file  . Alcohol Use: No    OB History    Grav Para Term Preterm Abortions TAB SAB Ect Mult Living                  Review of Systems All other systems negative except as documented in the HPI. All pertinent positives and negatives as reviewed in the HPI.  Allergies  Codeine and Penicillins  Home Medications   Current Outpatient Rx  Name Route Sig Dispense Refill  . ASPIRIN EC 81 MG PO TBEC Oral Take 81 mg by mouth daily.    . BC HEADACHE POWDER PO Oral Take 1 packet by mouth 2 (two) times daily as needed. For headache    . METFORMIN HCL 1000 MG PO TABS Oral Take 500 mg by mouth 2 (two) times daily with a meal.     . METOPROLOL  TARTRATE 25 MG PO TABS Oral Take 25 mg by mouth 2 (two) times daily.    Marland Kitchen PAROXETINE HCL 20 MG PO TABS Oral Take 20 mg by mouth every morning.    . TRIAMTERENE-HCTZ 37.5-25 MG PO TABS Oral Take 1 tablet by mouth daily.    Marland Kitchen EPINEPHRINE 0.3 MG/0.3ML IJ DEVI Intramuscular Inject 0.3 mLs (0.3 mg total) into the muscle as needed. 1 Device 0    BP 141/73  Pulse 94  Temp 98.7 F (37.1 C) (Oral)  Resp 18  SpO2 97%  Physical Exam  Nursing note and vitals reviewed. Constitutional: She is oriented to person, place, and time. She appears well-developed and well-nourished. No distress.  HENT:  Head: Normocephalic and atraumatic.  Mouth/Throat: Uvula is midline, oropharynx is clear and moist and mucous membranes are normal. No uvula swelling.    Eyes: Pupils are equal, round, and reactive to light.  Neck: Normal range of motion. Neck supple.  Cardiovascular: Normal rate, regular rhythm and normal heart sounds.   Pulmonary/Chest: Effort normal and breath sounds normal. No stridor. No respiratory distress. She has no wheezes.  Neurological: She is alert and oriented to person, place, and time.  Skin: Skin is warm and dry.    ED Course  Procedures (including critical care time)  2:05PM- The patient is vastly improving and feeling  better. Will move to the CDU for further observation.   MDM          Carlyle Dolly, PA-C 01/31/12 1527

## 2012-01-31 NOTE — ED Notes (Signed)
Pt presents to ed via ems from urgent care with c/o allergic reaction to unknown substance. Pt reported facial, tongue, hands and neck swelling. Pt reports this happened a few months ago.

## 2012-01-31 NOTE — ED Notes (Signed)
Pt was offered sips of water, swallowed w difficulty

## 2012-01-31 NOTE — ED Provider Notes (Signed)
3:31 PM Patient is in CDU under observation for angioedema.  Sign out received from Alison [name], [place].  Patient with angioedema of the tongue, treated at [facility] urgent care with epinephrine, decadron, and benadryl (around 11am per chart).  Plan is for continued monitoring and d/c home with steroids, benadryl, pepcid.  Patient reports she is starting to feel better.  States she has now eaten and is swallowing and breathing without difficulty.  Does note that she has to take her time to eat as she continues to have swelling.  Discussed with Ebbie Ridge, PA-C, who states he would like patient to stay for another 2-3 hours.  7:17 PM Patient continues to feel well.  States her tongue is nearly back to normal.  Has eaten.  Continued improvement.  Would like to go home.  On exam, tongue appears much improved.  No swelling noted.  Pharynx without edema.  No airway concerns.  Pt tolerating PO easily.  Pt states she has epi pen at home.  Will d/c home with benadryl, pepcid, prednisone, allergy follow up. Pt given return precautions.  Pt verbalizes understanding and agrees with plan.     Results for orders placed during the hospital encounter of 06/29/11  CBC      Component Value Range   WBC 9.7  4.0 - 10.5 K/uL   RBC 4.51  3.87 - 5.11 MIL/uL   Hemoglobin 13.0  12.0 - 15.0 g/dL   HCT 16.1  09.6 - 04.5 %   MCV 85.1  78.0 - 100.0 fL   MCH 28.8  26.0 - 34.0 pg   MCHC 33.9  30.0 - 36.0 g/dL   RDW 40.9  81.1 - 91.4 %   Platelets 281  150 - 400 K/uL  DIFFERENTIAL      Component Value Range   Neutrophils Relative 92 (*) 43 - 77 %   Neutro Abs 8.9 (*) 1.7 - 7.7 K/uL   Lymphocytes Relative 6 (*) 12 - 46 %   Lymphs Abs 0.6 (*) 0.7 - 4.0 K/uL   Monocytes Relative 2 (*) 3 - 12 %   Monocytes Absolute 0.2  0.1 - 1.0 K/uL   Eosinophils Relative 0  0 - 5 %   Eosinophils Absolute 0.0  0.0 - 0.7 K/uL   Basophils Relative 0  0 - 1 %   Basophils Absolute 0.0  0.0 - 0.1 K/uL  GLUCOSE, CAPILLARY      Component Value Range     Glucose-Capillary 212 (*) 70 - 99 mg/dL   No results found.    Stark, [place] 01/31/12 2147

## 2012-01-31 NOTE — ED Notes (Signed)
States she was okay last PM, when she woke this am, tongue felt odd, went to an appointment, and when time to leave, she noted her tongue was swollen, now getting worse. Tongue swollen, protruding, submandibular area swelling noted . Able to handle her own secretions

## 2012-01-31 NOTE — ED Provider Notes (Addendum)
History     CSN: [id number]  Arrival date & time 01/31/12  1026   First MD Initiated Contact with Patient 01/31/12 1039      Chief Complaint  Patient presents with  . Angioedema    (Consider location/radiation/quality/duration/timing/severity/associated sxs/prior treatment) Patient is a 66 y.o. female presenting with mouth sores. The history is provided by the patient.  Mouth Lesions  The current episode started today. The onset was sudden. The problem has been gradually worsening. The problem is moderate. Pertinent negatives include no mouth sores, no stridor, no neck pain, no cough and no wheezing. Associated symptoms comments: Tongue swelling, acutely this am, no stridor  Or wheezing., no fever, no new meds or food..    Past Medical History  Diagnosis Date  . Hypertension   . Diabetes mellitus     Past Surgical History  Procedure Date  . Abdominal hysterectomy   . Cholecystectomy     History reviewed. No pertinent family history.  History  Substance Use Topics  . Smoking status: Never Smoker   . Smokeless tobacco: Not on file  . Alcohol Use: No    OB History    Grav Para Term Preterm Abortions TAB SAB Ect Mult Living                  Review of Systems  Constitutional: Negative.   HENT: Positive for trouble swallowing and voice change. Negative for mouth sores and neck pain.   Respiratory: Negative for cough, choking, shortness of breath, wheezing and stridor.   Cardiovascular: Negative.     Allergies  Codeine and Penicillins  Home Medications   Current Outpatient Rx  Name Route Sig Dispense Refill  . ASPIRIN EC 81 MG PO TBEC Oral Take 81 mg by mouth daily.    Marland Kitchen EPINEPHRINE 0.3 MG/0.3ML IJ DEVI Intramuscular Inject 0.3 mLs (0.3 mg total) into the muscle as needed. 1 Device 0  . METFORMIN HCL 1000 MG PO TABS Oral Take 1,000 mg by mouth 2 (two) times daily with a meal.    . METOPROLOL SUCCINATE ER 25 MG PO TB24 Oral Take 25 mg by mouth daily.    Marland Kitchen  OVER THE COUNTER MEDICATION  bc powder    . PAROXETINE HCL 20 MG PO TABS Oral Take 20 mg by mouth every morning.    . TRIAMTERENE-HCTZ 37.5-25 MG PO TABS Oral Take 1 tablet by mouth daily.      BP 186/112  Pulse 90  Temp 98.7 F (37.1 C) (Oral)  Resp 20  SpO2 99%  Physical Exam  Nursing note and vitals reviewed. Constitutional: She is oriented to person, place, and time. She appears well-developed and well-nourished. No distress.  HENT:  Head: Normocephalic.  Right Ear: External ear normal.  Left Ear: External ear normal.  Nose: Nose normal.  Mouth/Throat: No oral lesions. No uvula swelling. Posterior oropharyngeal edema present. No posterior oropharyngeal erythema.    Neck: Normal range of motion. Neck supple.  Cardiovascular: Normal rate, normal heart sounds and intact distal pulses.   Pulmonary/Chest: Breath sounds normal. No stridor. No respiratory distress. She has no wheezes.  Neurological: She is alert and oriented to person, place, and time.  Skin: Skin is warm and dry.    ED Course  Procedures (including critical care time)  Labs Reviewed - No data to display No results found.   1. Idiopathic angioedema       MDM  Pt was stable at time of transfer.  Linna Hoff, MD 01/31/12 1118  Linna Hoff, MD 01/31/12 [phone]

## 2012-02-02 NOTE — ED Provider Notes (Signed)
Medical screening examination/treatment/procedure(s) were conducted as a shared visit with non-physician practitioner(s) and myself.  I personally evaluated the patient during the encounter   Please see my original co-signed note as well as PAC Lawyer's notes for initial eval, plan .  Gavin Pound. Khira Cudmore, MD 02/02/12 1651

## 2012-07-01 ENCOUNTER — Emergency Department (HOSPITAL_COMMUNITY)
Admission: EM | Admit: 2012-07-01 | Discharge: 2012-07-01 | Disposition: A | Discharge status: Home / Self Care | Payer: Medicare Other | Attending: Emergency Medicine | Admitting: Emergency Medicine

## 2012-07-01 ENCOUNTER — Encounter (HOSPITAL_COMMUNITY): Payer: Self-pay | Admitting: Emergency Medicine

## 2012-07-01 DIAGNOSIS — T783XXA Angioneurotic edema, initial encounter: Secondary | ICD-10-CM | POA: Insufficient documentation

## 2012-07-01 DIAGNOSIS — Z79899 Other long term (current) drug therapy: Secondary | ICD-10-CM | POA: Insufficient documentation

## 2012-07-01 DIAGNOSIS — E119 Type 2 diabetes mellitus without complications: Secondary | ICD-10-CM | POA: Insufficient documentation

## 2012-07-01 DIAGNOSIS — I10 Essential (primary) hypertension: Secondary | ICD-10-CM | POA: Insufficient documentation

## 2012-07-01 DIAGNOSIS — Z7982 Long term (current) use of aspirin: Secondary | ICD-10-CM | POA: Insufficient documentation

## 2012-07-01 MED ORDER — EPINEPHRINE 0.3 MG/0.3ML IJ DEVI
0.3000 mg | Freq: Once | INTRAMUSCULAR | Status: AC
  Administered 2012-07-01: 0.3 mg via INTRAMUSCULAR

## 2012-07-01 MED ORDER — FAMOTIDINE 20 MG PO TABS: 20.0000 mg | ORAL_TABLET | Freq: Two times a day (BID) | ORAL | Status: DC

## 2012-07-01 MED ORDER — PREDNISONE 20 MG PO TABS: 60.0000 mg | ORAL_TABLET | Freq: Every day | ORAL | Status: DC

## 2012-07-01 MED ORDER — FAMOTIDINE IN NACL 20-0.9 MG/50ML-% IV SOLN
20.0000 mg | Freq: Once | INTRAVENOUS | Status: AC
  Administered 2012-07-01: 20 mg via INTRAVENOUS
  Filled 2012-07-01: qty 50

## 2012-07-01 MED ORDER — DIPHENHYDRAMINE HCL 50 MG/ML IJ SOLN
25.0000 mg | Freq: Once | INTRAMUSCULAR | Status: AC
  Administered 2012-07-01: 25 mg via INTRAVENOUS
  Filled 2012-07-01: qty 1

## 2012-07-01 MED ORDER — DIPHENHYDRAMINE HCL 25 MG PO TABS: 25.0000 mg | ORAL_TABLET | Freq: Four times a day (QID) | ORAL | Status: AC

## 2012-07-01 MED ORDER — METHYLPREDNISOLONE SODIUM SUCC 125 MG IJ SOLR
125.0000 mg | Freq: Once | INTRAMUSCULAR | Status: AC
  Administered 2012-07-01: 125 mg via INTRAVENOUS
  Filled 2012-07-01: qty 2

## 2012-07-01 MED ORDER — EPINEPHRINE 0.3 MG/0.3ML IJ DEVI: 0.3000 mg | Freq: Once | INTRAMUSCULAR | Status: DC

## 2012-07-01 MED ORDER — EPINEPHRINE 0.3 MG/0.3ML IJ DEVI
INTRAMUSCULAR | Status: AC
  Filled 2012-07-01: qty 0.3

## 2012-07-01 NOTE — ED Notes (Addendum)
Pt able to stick out tongue,states she feels less swollen.

## 2012-07-01 NOTE — Progress Notes (Signed)
9:37 AM Pt had been seen by Dr. Norlene Campbell for angioedema.  She has been observed for six hours and her swelling is gradually improving.  I reviewed Dr. Sharman Cheek prescriptions for EpiPen, Prednisone, Pepcid, and Benadryl, and her advice for pt to seek followup with [facility].  Released.

## 2012-07-01 NOTE — ED Provider Notes (Signed)
History     CSN: [id number]  Arrival date & time 07/01/12  0343   First MD Initiated Contact with Patient 07/01/12 [phone]      Chief Complaint  Patient presents with  . Oral Swelling    (Consider location/radiation/quality/duration/timing/severity/associated sxs/prior treatment) HPI 67 yo female presents to the ER with complaint of tongue swelling.  Sxs started about 4 hours ago.  Initially just the right side of the tongue, but has continued to progress.  Pt is able to breathe fine, but is having difficulty swallowing.  She c/o pain to the base of her tongue and swelling to her submandibular space.  Pt had tooth pulled 2 weeks ago, but denies any fever, dental pain, drainage.  Pt has had this occur several times before, always improved with allergy treatments.  Pt is not on ACE or ARB.  No cause has been found yet.  Past Medical History  Diagnosis Date  . Hypertension   . Diabetes mellitus     Past Surgical History  Procedure Laterality Date  . Abdominal hysterectomy    . Cholecystectomy      No family history on file.  History  Substance Use Topics  . Smoking status: Never Smoker   . Smokeless tobacco: Not on file  . Alcohol Use: No    OB History   Grav Para Term Preterm Abortions TAB SAB Ect Mult Living                  Review of Systems  All other systems reviewed and are negative.    Allergies  Codeine and Penicillins  Home Medications   Current Outpatient Rx  Name  Route  Sig  Dispense  Refill  . aspirin EC 81 MG tablet   Oral   Take 81 mg by mouth daily.         . famotidine (PEPCID) 20 MG tablet   Oral   Take 20 mg by mouth 2 (two) times daily.         . metFORMIN (GLUCOPHAGE) 1000 MG tablet   Oral   Take 500 mg by mouth 2 (two) times daily with a meal.          . metoprolol tartrate (LOPRESSOR) 25 MG tablet   Oral   Take 25 mg by mouth 2 (two) times daily.         Marland Kitchen PARoxetine (PAXIL) 20 MG tablet   Oral   Take 20 mg by mouth  every morning.         . triamterene-hydrochlorothiazide (MAXZIDE-25) 37.5-25 MG per tablet   Oral   Take 1 tablet by mouth daily.         . Aspirin-Salicylamide-Caffeine (BC HEADACHE POWDER PO)   Oral   Take 1 packet by mouth 2 (two) times daily as needed. For headache           BP 146/68  Pulse 90  Temp(Src) 98.7 F (37.1 C) (Oral)  Resp 25  SpO2 97%  Physical Exam  Nursing note and vitals reviewed. Constitutional: She appears well-developed and well-nourished. She appears distressed.  HENT:  Head: Normocephalic and atraumatic.  Right Ear: External ear normal.  Left Ear: External ear normal.  Nose: Nose normal.  Significant soft tissue swelling of the tongue and submental area, firm but not woody induration.  No fluctuance.  Pt is drooling slightly.  Able to visualize the posterior pharynx with tongue blade.  No angioedema of the lips.  Eyes: Conjunctivae and EOM  are normal. Pupils are equal, round, and reactive to light.  Neck: Normal range of motion. Neck supple. No JVD present. No tracheal deviation present. No thyromegaly present.  Cardiovascular: Normal rate, regular rhythm, normal heart sounds and intact distal pulses.  Exam reveals no gallop and no friction rub.   No murmur heard. Pulmonary/Chest: Effort normal and breath sounds normal. No stridor. No respiratory distress. She has no wheezes. She has no rales. She exhibits no tenderness.  Abdominal: Soft. Bowel sounds are normal. She exhibits no distension and no mass. There is no tenderness. There is no rebound and no guarding.  Musculoskeletal: Normal range of motion. She exhibits no edema and no tenderness.  Lymphadenopathy:    She has no cervical adenopathy.  Skin: Skin is warm and dry. No rash noted. No erythema. No pallor.    ED Course  Procedures (including critical care time)  CRITICAL CARE Performed by: Olivia Mackie   Total critical care time: 60 min  Critical care time was exclusive of  separately billable procedures and treating other patients.  Critical care was necessary to treat or prevent imminent or life-threatening deterioration.  Critical care was time spent personally by me on the following activities: development of treatment plan with patient and/or surrogate as well as nursing, discussions with consultants, evaluation of patient's response to treatment, examination of patient, obtaining history from patient or surrogate, ordering and performing treatments and interventions, ordering and review of laboratory studies, ordering and review of radiographic studies, pulse oximetry and re-evaluation of patient's condition.   Labs Reviewed - No data to display No results found.   1. Angioedema, subsequent encounter       MDM  67 yo female with angioedema of the tongue and submental space.  Do not feel sxs are due to ludwig's angina, but will monitor closely for improvement after epi pen, solumedrol, benadryl, pepcid.  Pt reassessed at 0515 am with significant improvement.  Now able to stick out the tongue and submental space has softened and decreased.  Will continue to monitor.        Olivia Mackie, MD 07/01/12 [phone]

## 2012-07-01 NOTE — ED Notes (Signed)
Patient states she has been having tongue swelling for last 4 hours, increasing.  Patient states she is having some mild shortness of breath.

## 2012-09-21 ENCOUNTER — Telehealth: Payer: Self-pay | Admitting: *Deleted

## 2012-09-21 ENCOUNTER — Encounter: Payer: Self-pay | Admitting: Internal Medicine

## 2012-09-21 ENCOUNTER — Ambulatory Visit (INDEPENDENT_AMBULATORY_CARE_PROVIDER_SITE_OTHER): Payer: Medicare Other | Admitting: Internal Medicine

## 2012-09-21 VITALS — BP 124/68 | HR 83 | Temp 97.7°F | Resp 12 | Ht 64.25 in | Wt 183.0 lb

## 2012-09-21 DIAGNOSIS — E119 Type 2 diabetes mellitus without complications: Secondary | ICD-10-CM

## 2012-09-21 NOTE — Patient Instructions (Addendum)
Please take 500 mg Metformin in am and 1000 mg with dinner. Continue Actos for now. We will call you with the results of your HbA1C. Return to clinic in 3 months with your sugar log.  PATIENT INSTRUCTIONS FOR TYPE 2 DIABETES:  DIET AND EXERCISE Diet and exercise is an important part of diabetic treatment.  We recommended aerobic exercise in the form of brisk walking (working between 40-60% of maximal aerobic capacity, similar to brisk walking) for 150 minutes per week (such as 30 minutes five days per week) along with 3 times per week performing 'resistance' training (using various gauge rubber tubes with handles) 5-10 exercises involving the major muscle groups (upper body, lower body and core) performing 10-15 repetitions (or near fatigue) each exercise. Start at half the above goal but build slowly to reach the above goals. If limited by weight, joint pain, or disability, we recommend daily walking in a swimming pool with water up to waist to reduce pressure from joints while allow for adequate exercise.    BLOOD GLUCOSES Monitoring your blood glucoses is important for continued management of your diabetes. Please check your blood glucoses 2-4 times a day: fasting, before meals and at bedtime (you can rotate these measurements - e.g. one day check before the 3 meals, the next day check before 2 of the meals and before bedtime, etc.   HYPOGLYCEMIA (low blood sugar) Hypoglycemia is usually a reaction to not eating, exercising, or taking too much insulin/ other diabetes drugs.  Symptoms include tremors, sweating, hunger, confusion, headache, etc. Treat IMMEDIATELY with 15 grams of Carbs:   4 glucose tablets    cup regular juice/soda   2 tablespoons raisins   4 teaspoons sugar   1 tablespoon honey Recheck blood glucose in 15 mins and repeat above if still symptomatic/blood glucose <100. Please contact our office at [phone] if you have questions about how to next handle your  insulin.  RECOMMENDATIONS TO REDUCE YOUR RISK OF DIABETIC COMPLICATIONS: * Take your prescribed MEDICATION(S). * Follow a DIABETIC diet: Complex carbs, fiber rich foods, heart healthy fish twice weekly, (monounsaturated and polyunsaturated) fats * AVOID saturated/trans fats, high fat foods, >2,300 mg salt per day. * EXERCISE at least 5 times a week for 30 minutes or preferably daily.  * DO NOT SMOKE OR DRINK more than 1 drink a day. * Check your FEET every day. Do not wear tightfitting shoes. Contact us if you develop an ulcer * See your EYE doctor once a year or more if needed * Get a FLU shot once a year * Get a PNEUMONIA vaccine once before and once after age 61 years  GOALS:  * Your Hemoglobin A1c of <7%  * Your Systolic BP should be 140 or lower  * Your Diastolic BP should be 80 or lower  * Your HDL (Good Cholesterol) should be 40 or higher  * Your LDL (Bad Cholesterol) should be 100 or lower  * Your Triglycerides should be 150 or lower  * Your Urine microalbumin (kidney function) should be <30 * Your Body Mass Index should be 25 or lower   We will be glad to help you achieve these goals. Our telephone number is: [phone].

## 2012-09-21 NOTE — Progress Notes (Signed)
Patient ID: Alison [name], female   DOB: May 24, 1945, 67 y.o.   MRN: [id number] HPI: Alison [name] is a 67 y.o.-year-old female, self-referred for management of DM2, non-insulin-dependent, uncontrolled, without complications. She is seen by [facility]. She has not seen Dr. Amador Cunas for the last 10 years as she only had M'aid.  Patient has been diagnosed with diabetes in 2004; she has not been on insulin before. Last hemoglobin A1c was: Lab Results  Component Value Date   HGBA1C 7.3* 05/02/2008    Pt is on a regimen of: - Metformin 500 mg po tid - Actos 30 mg daily - started 2 mo ago  Pt checks her sugars 0-2 x a day and they are: - am: 105-133 in last 2 weeks - pm (before or >2h after lunch): 95-128 No lows. Lowest sugar was 95; she does not know if she has hypoglycemia awareness. Highest sugar was close to 200s.  Pt's meals are: - Breakfast: sandwich - Lunch: sandwich - Dinner: soup, sandwich, ground beef with vegetables - Snacks: 3  Pt does not have chronic kidney disease, last BUN/creatinine was:  Lab Results  Component Value Date   BUN 8 06/29/2011   CREATININE 0.70 06/29/2011  She is on Lisinopril.  Pt's last eye exam was in 09/2011. Has cataracts - ? DR. Denies numbness and tingling in her legs.  I reviewed her chart and she also has a history of repeated angioedema and repeated ED visits, HTN.  She feels here left eye is swollen and itching, now better.  Pt has FH of DM in mother.  ROS: Constitutional: no weight gain/loss, + fatigue, increased appetite, no subjective hyperthermia/hypothermia, poor sleep, nocturia Eyes: + occasional blurry vision, no xerophthalmia ENT: +sore throat, no nodules palpated in throat, + dysphagia/odynophagia, + hoarseness Cardiovascular: +CP/SOB/+palpitations/+ swelling Respiratory: no cough/SOB Gastrointestinal: no N/V/D/+ C Musculoskeletal: no muscle/joint aches Skin: + rash, easy bruising, itching, hair loss Neurological: no  tremors/numbness/tingling/dizziness, + headaches-better Psychiatric: no depression/anxiety  Past Medical History  Diagnosis Date  . Hypertension   . Diabetes mellitus    Past Surgical History  Procedure Laterality Date  . Abdominal hysterectomy    . Cholecystectomy     History   Social History  . Marital Status: Single    Spouse Name: N/A    Number of Children: 6   Occupational History  . retired   Social History Main Topics  . Smoking status: Never Smoker   . Smokeless tobacco: Not on file  . Alcohol Use: No  . Drug Use: No   Current Outpatient Prescriptions on File Prior to Visit  Medication Sig Dispense Refill  . aspirin EC 81 MG tablet Take 81 mg by mouth daily.      . Aspirin-Salicylamide-Caffeine (BC HEADACHE POWDER PO) Take 1 packet by mouth 2 (two) times daily as needed. For headache      . diphenhydrAMINE (BENADRYL) 25 MG tablet Take 1 tablet (25 mg total) by mouth every 6 (six) hours.  20 tablet  0  . EPINEPHrine (EPI-PEN) 0.3 mg/0.3 mL DEVI Inject 0.3 mLs (0.3 mg total) into the muscle once. Then proceed to the nearest ER  1 Device  2  . famotidine (PEPCID) 20 MG tablet Take 1 tablet (20 mg total) by mouth 2 (two) times daily.  30 tablet  0  . metFORMIN (GLUCOPHAGE) 1000 MG tablet Take 500 mg by mouth 2 (two) times daily with a meal.       . PARoxetine (PAXIL) 20  MG tablet Take 20 mg by mouth every morning.      . triamterene-hydrochlorothiazide (MAXZIDE-25) 37.5-25 MG per tablet Take 1 tablet by mouth daily.      . metoprolol tartrate (LOPRESSOR) 25 MG tablet Take 25 mg by mouth 2 (two) times daily.       No current facility-administered medications on file prior to visit.   Allergies  Allergen Reactions  . Codeine Hives  . Penicillins Hives   History reviewed. No pertinent family history.  PE: BP 124/68  Pulse 83  Temp(Src) 97.7 F (36.5 C) (Oral)  Resp 12  Ht 5' 4.25" (1.632 m)  Wt 183 lb (83.008 kg)  BMI 31.17 kg/m2  SpO2 97% Wt Readings from  Last 3 Encounters:  09/21/12 183 lb (83.008 kg)  05/02/08 207 lb (93.895 kg)  01/27/08 207 lb (93.895 kg)   Constitutional: overweight, in NAD Eyes: PERRLA, EOMI, no exophthalmos; swollen left upper eyelid, without corneal changes ENT: moist mucous membranes, no thyromegaly, no cervical lymphadenopathy Cardiovascular: RRR, No MRG Respiratory: CTA B Gastrointestinal: abdomen soft, NT, ND, BS+ Musculoskeletal: no deformities, strength intact in all 4 Skin: moist, warm, no rashes Neurological: slight tremor with outstretched hands, DTR normal in all 4  ASSESSMENT: 1. DM2, non-insulin-dependent, uncontrolled, without complications  PLAN:  1. Patient with long-standing mild diabetes, with no known recent hemoglobin A1c, but with sugars at goal for review of her CBG log. - Her sugars in the morning are between 105 and 133 in the last 2 weeks, improved from being up to 180 at the beginning of the month. Her afternoon sugars are better. - I advised her to move the lunchtime metformin dose to dinnertime, but otherwise stay on the 1500 mg daily. - since she is doing so well, I advised her to continue Actos 30 mg daily - we'll check a hemoglobin A1c today - I advised her to reestablish care with Dr. Amador Cunas, since she changed her insurance - given sugar log and advised how to fill it and to bring it at next appt - Check sugars 1 to 2 times a day - given foot care handout and explained the principles - given instructions for hypoglycemia management "15-15 rule" - given general instructions about diabetes (the patient instructions)  Office Visit on 09/21/2012  Component Date Value Range Status  . Hemoglobin A1C 09/21/2012 8.2* 4.6 - 6.5 % Final   Glycemic Control Guidelines for People with Diabetes:Non Diabetic:  <6%Goal of Therapy: <7%Additional Action Suggested:  >8%    Based on her sugars in the last 2 weeks, her next hemoglobin A1c will be lower, around 7. We'll continue the above  management for now.

## 2012-09-21 NOTE — Telephone Encounter (Signed)
Called Alison [name] and lvm that her HgbA1C was 8.2.  This is not bad, but based on the sugar levels you showed Dr Elvera Lennox today, she feels your next A1C will be better. Call if you have any questions or problems.

## 2012-10-07 ENCOUNTER — Telehealth: Payer: Self-pay | Admitting: *Deleted

## 2012-10-07 ENCOUNTER — Telehealth: Payer: Self-pay | Admitting: Internal Medicine

## 2012-10-07 NOTE — Telephone Encounter (Signed)
Pt called requesting an Rx for diabetic shoes. If possible, she would like it faxed to [facility] (fax# [phone]). Please advise. Thank you.

## 2012-10-07 NOTE — Telephone Encounter (Signed)
This needs to be taken care of by patient's endocrinologist if she qualifies I have not seen this patient in over 10 years Notify patient that she must qualify for diabetic shoes and must have diabetic complications such as foot deformity or severe neuropathy

## 2012-10-07 NOTE — Telephone Encounter (Signed)
Pt is requesting a Rx for diabetic shoes, she would like it faxed to [facility] (fax: [phone]). Please advise. Thank you.

## 2012-10-07 NOTE — Telephone Encounter (Signed)
Please read below...

## 2012-10-09 NOTE — Telephone Encounter (Signed)
Called pt and scheduled appt for a diabetic foot exam on Monday, June 30th at 10:30. Tried to call her PCP and get a NPI number (#[phone]), they stated Dr Roseanne Reno is no longer there and they cannot give out an NPI number. They did not have a forwarding number. Called pt and she stated she is getting a new PCP. Be advised.

## 2012-10-19 ENCOUNTER — Ambulatory Visit: Payer: Medicare Other | Admitting: Internal Medicine

## 2012-10-22 ENCOUNTER — Encounter: Payer: Self-pay | Admitting: Internal Medicine

## 2012-10-22 ENCOUNTER — Ambulatory Visit (INDEPENDENT_AMBULATORY_CARE_PROVIDER_SITE_OTHER): Payer: Medicare HMO | Admitting: Internal Medicine

## 2012-10-22 ENCOUNTER — Telehealth: Payer: Self-pay | Admitting: *Deleted

## 2012-10-22 VITALS — BP 112/74 | HR 90 | Temp 98.2°F | Resp 10 | Ht 64.25 in | Wt 180.9 lb

## 2012-10-22 DIAGNOSIS — E119 Type 2 diabetes mellitus without complications: Secondary | ICD-10-CM

## 2012-10-22 LAB — HM DIABETES EYE EXAM

## 2012-10-22 NOTE — Telephone Encounter (Signed)
Called [facility] [phone]) and the representative told me that all they needed was a written order (on rx pad or letter on letterhead) from the physician including pt's diagnosis (with diagnosis code) and evaluation notes from pt's office visit faxed to them at Fax [phone]. Please advise. Thank you.

## 2012-10-22 NOTE — Progress Notes (Signed)
To Whom It Might Concern,  I have seen Alison [name] in the endocrinology clinic today for a foot exam to serve her for obtaining diabetic shoes. Please see the details of her diabetic foot exam in the below note.   Rx: Diabetic shoes #1 Refill prn ICD9 Code: 250.00  Carlus Pavlov, MD PhD [facility] [address] [place], [place] [address] Tel. [phone] Fax [phone]

## 2012-10-22 NOTE — Telephone Encounter (Signed)
Completed and faxed to [facility] at [phone].

## 2012-10-22 NOTE — Telephone Encounter (Signed)
Will do this and bring to you.

## 2012-10-22 NOTE — Progress Notes (Signed)
Patient ID: Alison [name], female   DOB: 1945/05/31, 67 y.o.   MRN: [id number]  HPI: Alison [name] is a 67 y.o.-year-old female, with h/o DM2, dx 2004, non-insulin-dependent, uncontrolled, without complications. She is here for a foot exam to be able to get diabetic shoes.  She saw her eye dr today >> needs cataract sx soon; apparently has DR, too, but unclear.    Last hemoglobin A1c was: Lab Results  Component Value Date   HGBA1C 8.2* 09/21/2012    Pt is on a regimen of: - Metformin 500 mg po tid - Actos 30 mg daily - started 2 mo ago  Pt checks her sugars 2 x a day and they are at goal, max 154. No lows.   Pt's last eye exam was in today. Denies numbness and tingling in her legs.  ROS: Only positive for tinnitus, itching, swelling feet, HA, blurry vision from eye exam today.  PE: BP 112/74  Pulse 90  Temp(Src) 98.2 F (36.8 C) (Oral)  Resp 10  Ht 5' 4.25" (1.632 m)  Wt 180 lb 14.4 oz (82.056 kg)  BMI 30.81 kg/m2  SpO2 97% Wt Readings from Last 3 Encounters:  10/22/12 180 lb 14.4 oz (82.056 kg)  09/21/12 183 lb (83.008 kg)  05/02/08 207 lb (93.895 kg)   Foot exam performed today:  No rashes, ulcers, cuts, calluses, onychodystrophy. Very dry, flaky skin.  normal dorsalis pedis and post tibialis pulses  normal sensation to 10 g monofilament bilaterally  decreased vibratory sensation to 128 Hz tuning fork in right big toe  Pes planus bilaterally  ASSESSMENT: 1. Diabetic foot exam - slight decrease in vibratory sensation in the right big toe - flatfeet  - Very dry skin  PLAN:  1. Patient with long-standing mild diabetes, with recent hemoglobin A1c at 8.2% coming for a foot exam to obtain a Rx for diabetic shoes. She got them in the past from [facility] (fax# [phone]).  - foot exam performed. Will fax form to the above fax nr. - advised her to use Vaseline lotion to avoid skin break

## 2012-10-22 NOTE — Patient Instructions (Addendum)
We will fax the prescription to the diabetic shoes company.

## 2012-12-12 ENCOUNTER — Encounter (HOSPITAL_COMMUNITY): Payer: Self-pay | Admitting: Emergency Medicine

## 2012-12-12 ENCOUNTER — Emergency Department (INDEPENDENT_AMBULATORY_CARE_PROVIDER_SITE_OTHER)
Admission: EM | Admit: 2012-12-12 | Discharge: 2012-12-12 | Disposition: A | Discharge status: Home / Self Care | Payer: Medicare HMO | Source: Home / Self Care

## 2012-12-12 DIAGNOSIS — M533 Sacrococcygeal disorders, not elsewhere classified: Secondary | ICD-10-CM

## 2012-12-12 MED ORDER — MELOXICAM 15 MG PO TABS: 15.0000 mg | ORAL_TABLET | Freq: Every day | ORAL | Status: DC | PRN

## 2012-12-12 MED ORDER — TRAMADOL HCL 50 MG PO TABS: 50.0000 mg | ORAL_TABLET | Freq: Four times a day (QID) | ORAL | Status: DC | PRN

## 2012-12-12 NOTE — ED Notes (Signed)
Pt c/o left lower back/hip pain onset 2-3 weeks Denies: inj/trauma... Reports she may have pulled a muscle playing w/a pt Pain is constant and increases activity Alert w/no signs of acute distress.

## 2012-12-12 NOTE — ED Provider Notes (Signed)
Alison [name] is a 67 y.o. female who presents to Urgent Care today for left low back pain for 3 weeks. Patient has been doing some heavy lifting recently. Additionally one week ago her handicapped daughter fell onto her. She denies any other injury. She has pain in the left low back. She denies any radiating pain weakness or numbness bowel bladder dysfunction or difficulty walking. She's tried over-the-counter pain medications which have not helped much. She's tried a heating pad which helps some. The pain is moderate and worse with activity and better with rest.   PMH reviewed. Diabetes and hypertension History  Substance Use Topics  . Smoking status: Never Smoker   . Smokeless tobacco: Not on file  . Alcohol Use: No   ROS as above Medications reviewed. No current facility-administered medications for this encounter.   Current Outpatient Prescriptions  Medication Sig Dispense Refill  . aspirin EC 81 MG tablet Take 81 mg by mouth daily.      . Aspirin-Salicylamide-Caffeine (BC HEADACHE POWDER PO) Take 1 packet by mouth 2 (two) times daily as needed. For headache      . cyanocobalamin 1000 MCG tablet Take 100 mcg by mouth daily.      . diphenhydrAMINE (BENADRYL) 25 MG tablet Take 1 tablet (25 mg total) by mouth every 6 (six) hours.  20 tablet  0  . EPINEPHrine (EPI-PEN) 0.3 mg/0.3 mL DEVI Inject 0.3 mLs (0.3 mg total) into the muscle once. Then proceed to the nearest ER  1 Device  2  . lisinopril (PRINIVIL,ZESTRIL) 30 MG tablet Take 30 mg by mouth daily.      . meloxicam (MOBIC) 15 MG tablet Take 1 tablet (15 mg total) by mouth daily as needed for pain.  30 tablet  0  . metFORMIN (GLUCOPHAGE) 1000 MG tablet Take 500 mg by mouth 2 (two) times daily with a meal.       . PARoxetine (PAXIL) 20 MG tablet Take 20 mg by mouth every morning.      . pioglitazone (ACTOS) 30 MG tablet Take 30 mg by mouth daily.      . traMADol (ULTRAM) 50 MG tablet Take 1 tablet (50 mg total) by mouth every 6 (six)  hours as needed for pain.  20 tablet  0  . triamterene-hydrochlorothiazide (MAXZIDE-25) 37.5-25 MG per tablet Take 1 tablet by mouth daily.        Exam:  BP 139/89  Pulse 84  Temp(Src) 99.1 F (37.3 C) (Oral)  Resp 18  SpO2 98% Gen: Well NAD MSK: Back: Nontender spinal midline. Tender palpation left SI joint. Negative straight leg raise test Negative contralateral straight raise test.  Negative Faber test Positive pretzel stretch Normal back range of motion Strength is intact bilateral lower extremities/ Capillary refill and sensation are intact distally  No results found for this or any previous visit (from the past 24 hour(s)). No results found.  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  Plan to treat with meloxicam, Flexeril, heating pad, exercise program. Followup with Dr. Katrinka Blazing.  Discussed warning signs or symptoms. Please see discharge instructions. Patient expresses understanding.      Rodolph Bong, MD 12/12/12 [phone]

## 2012-12-18 ENCOUNTER — Ambulatory Visit: Payer: Medicare Other | Admitting: Family

## 2012-12-22 ENCOUNTER — Ambulatory Visit: Payer: Medicare Other | Admitting: Internal Medicine

## 2012-12-22 ENCOUNTER — Encounter: Payer: Self-pay | Admitting: Internal Medicine

## 2012-12-22 ENCOUNTER — Ambulatory Visit (INDEPENDENT_AMBULATORY_CARE_PROVIDER_SITE_OTHER): Payer: Medicare HMO | Admitting: Internal Medicine

## 2012-12-22 VITALS — BP 112/58 | HR 89 | Temp 98.4°F | Resp 10 | Wt 183.0 lb

## 2012-12-22 DIAGNOSIS — N289 Disorder of kidney and ureter, unspecified: Secondary | ICD-10-CM

## 2012-12-22 DIAGNOSIS — E119 Type 2 diabetes mellitus without complications: Secondary | ICD-10-CM

## 2012-12-22 LAB — HEMOGLOBIN A1C: Hgb A1c MFr Bld: 6.3 % (ref 4.6–6.5)

## 2012-12-22 LAB — BASIC METABOLIC PANEL
Chloride: 98 mEq/L (ref 96–112)
Creatinine, Ser: 1.3 mg/dL — ABNORMAL HIGH (ref 0.4–1.2)
Potassium: 3.2 mEq/L — ABNORMAL LOW (ref 3.5–5.1)
Sodium: 135 mEq/L (ref 135–145)

## 2012-12-22 MED ORDER — SITAGLIPTIN PHOSPHATE 100 MG PO TABS: 100.0000 mg | ORAL_TABLET | Freq: Every day | ORAL | Status: DC

## 2012-12-22 NOTE — Patient Instructions (Addendum)
Please continue Metformin 1000 mg in am and 500 mg with dinner. Stop Actos. Start Januvia 100 mg in am - this medication will not cause weight gain and it acts on your after-meal sugars.

## 2012-12-22 NOTE — Progress Notes (Signed)
Patient ID: Alison [name], female   DOB: 1946/03/28, 67 y.o.   MRN: [id number] HPI: Alison [name] is a 67 y.o.-year-old female, self-referred for management of DM2, dx 2004, non-insulin-dependent, uncontrolled, without complications. She has a new PCP. She has not seen Dr. Amador Cunas for the last 10 years as she only had M'aid.   Last hemoglobin A1c was: Lab Results  Component Value Date   HGBA1C 8.2* 09/21/2012   Pt is on a regimen of: - Metformin 500 mg po in am and 1000 mg at dinnertime.  - Actos 30 mg daily - started 08/2012  She is worried about Actos and bladder ca. Would want to come off the medication.   Pt checks her sugars 0-1 x a day (no log) and they are: - am: 105-133 in last 2 weeks >> less than 100 - pm (before or >2h after lunch): 95-128 >> 90-100 No lows. Lowest sugar was 70, no recently; she does not know if she has hypoglycemia awareness. Highest sugar in last 3 months 140.  Pt does not have chronic kidney disease, last BUN/creatinine was: Lab Results  Component Value Date   BUN 8 06/29/2011   CREATININE 0.70 06/29/2011  She is on Lisinopril.  Pt's last eye exam was in 10/2012. Has cataracts - ? DR. Denies numbness and tingling in her legs.  She also has a history of repeated angioedema and repeated ED visits, HTN.  She feels here left eye is swollen and itching, now better.  Needs refills of test strips and lancets - does not remember brand.  I reviewed pt's medications, allergies, PMH, social hx, family hx and no changes required, except as mentioned above.  ROS: Constitutional: + weight gain, no fatigue, increased appetite, + hot flushes, + poor sleep, nocturia Eyes: + occasional blurry vision, no xerophthalmia ENT: no sore throat, no nodules palpated in throat, no dysphagia/odynophagia, no hoarseness; decreased hearing Cardiovascular: no CP/SOB/palpitations/swelling Respiratory: no cough/SOB Gastrointestinal: no N/V/D/C, + heartburn Musculoskeletal: + muscle  aches/no joint aches Skin: no rash, + itching, + hair loss Neurological: + tremors/no numbness/tingling/dizziness, + headaches-better  PE: BP 112/58  Pulse 89  Temp(Src) 98.4 F (36.9 C) (Oral)  Resp 10  Wt 183 lb (83.008 kg)  BMI 31.17 kg/m2  SpO2 98% Wt Readings from Last 3 Encounters:  12/22/12 183 lb (83.008 kg)  10/22/12 180 lb 14.4 oz (82.056 kg)  09/21/12 183 lb (83.008 kg)   Constitutional: overweight, in NAD Eyes: PERRLA, EOMI, no exophthalmos; swollen left upper eyelid, without corneal changes ENT: moist mucous membranes, no thyromegaly, no cervical lymphadenopathy Cardiovascular: RRR, No MRG Respiratory: CTA B Gastrointestinal: abdomen soft, NT, ND, BS+ Musculoskeletal: no deformities, strength intact in all 4 Skin: moist, warm, no rashes  ASSESSMENT: 1. DM2, non-insulin-dependent, uncontrolled, without complications  PLAN:  1. Patient with long-standing mild diabetes, with no known recent hemoglobin A1c, but with sugars at goal for review of her CBG log. - Her sugars great per her report - does not bring a log - I advised her to continue Metformin 1500 mg daily. - I agree with stopping Actos 30 mg daily - start Januvia 100 mg daily (no h/o pancreatitis) - sent to pharmacy - we'll check a hemoglobin A1c today - I advised her to let me know the name of her new PCP - given sugar log and advised how to fill it and to bring it at next appt - Check sugars 1 x a day, rotating checks  Office Visit on 12/22/2012  Component Date Value Range Status  . Hemoglobin A1C 12/22/2012 6.3  4.6 - 6.5 % Final   Glycemic Control Guidelines for People with Diabetes:Non Diabetic:  <6%Goal of Therapy: <7%Additional Action Suggested:  >8%   . Sodium 12/22/2012 135  135 - 145 mEq/L Final  . Potassium 12/22/2012 3.2* 3.5 - 5.1 mEq/L Final  . Chloride 12/22/2012 98  96 - 112 mEq/L Final  . CO2 12/22/2012 31  19 - 32 mEq/L Final  . Glucose, Bld 12/22/2012 204* 70 - 99 mg/dL Final  .  BUN 56/21/3086 23  6 - 23 mg/dL Final  . Creatinine, Ser 12/22/2012 1.3* 0.4 - 1.2 mg/dL Final  . Calcium 57/84/6962 9.4  8.4 - 10.5 mg/dL Final  . GFR 95/28/4132 55.15* >60.00 mL/min Final   HbA1C excellent! GFR decreased in last year. Will advise to cut Januvia in half. I will FWD note to PCP when pt lets me know his name. K mildly low. Will repeat BMP in 1 mo.   She will have an appt with her PCP in November. I will fax her my note. Dr Dorris Fetch at the [facility] (Phone: #[phone] Fax#[phone])

## 2012-12-23 ENCOUNTER — Telehealth: Payer: Self-pay | Admitting: *Deleted

## 2012-12-23 DIAGNOSIS — N289 Disorder of kidney and ureter, unspecified: Secondary | ICD-10-CM | POA: Insufficient documentation

## 2012-12-23 NOTE — Telephone Encounter (Signed)
Called pt and advised her of her lab results. Advised her that her A1c has improved from 8.2 to 6.3%. This is great! That her potassium is a little low, she can eat a banana a day in the next 3 days. Also, her kidney function has decreased since last visit (creatinine increased from 0.7 to 1.3). I would like her to follow with her PCP for this and the low potassium. Dr Elvera Lennox needs to know the name of your PCP to send them her notes and the results. Pt states she has an appt on Nov 13th with Dr Dorris Fetch at the [facility] and [facility] (Phone: #[phone] Fax#[phone]). Please advise.

## 2012-12-23 NOTE — Telephone Encounter (Signed)
Great! I will send Dr Azucena Kuba my note. I think it would be best for the pt to return in 1 month to recheck the BMP (lab appt).

## 2012-12-24 ENCOUNTER — Telehealth: Payer: Self-pay | Admitting: *Deleted

## 2012-12-24 NOTE — Telephone Encounter (Signed)
Called pt and advised her per Dr Elvera Lennox to return in 1 month to the lab for a BMP. Orders are in the computer. Pt understood.

## 2013-02-19 ENCOUNTER — Emergency Department (HOSPITAL_COMMUNITY): Payer: Medicare HMO

## 2013-02-19 ENCOUNTER — Encounter (HOSPITAL_COMMUNITY): Payer: Self-pay | Admitting: Emergency Medicine

## 2013-02-19 ENCOUNTER — Emergency Department (HOSPITAL_COMMUNITY)
Admission: EM | Admit: 2013-02-19 | Discharge: 2013-02-20 | Disposition: A | Discharge status: Home / Self Care | Payer: Medicare HMO | Attending: Emergency Medicine | Admitting: Emergency Medicine

## 2013-02-19 DIAGNOSIS — R002 Palpitations: Secondary | ICD-10-CM | POA: Insufficient documentation

## 2013-02-19 DIAGNOSIS — Z8669 Personal history of other diseases of the nervous system and sense organs: Secondary | ICD-10-CM | POA: Insufficient documentation

## 2013-02-19 DIAGNOSIS — I10 Essential (primary) hypertension: Secondary | ICD-10-CM | POA: Insufficient documentation

## 2013-02-19 DIAGNOSIS — E119 Type 2 diabetes mellitus without complications: Secondary | ICD-10-CM | POA: Insufficient documentation

## 2013-02-19 DIAGNOSIS — Z880 Allergy status to penicillin: Secondary | ICD-10-CM | POA: Insufficient documentation

## 2013-02-19 DIAGNOSIS — R300 Dysuria: Secondary | ICD-10-CM | POA: Insufficient documentation

## 2013-02-19 DIAGNOSIS — Z79899 Other long term (current) drug therapy: Secondary | ICD-10-CM | POA: Insufficient documentation

## 2013-02-19 DIAGNOSIS — M542 Cervicalgia: Secondary | ICD-10-CM | POA: Insufficient documentation

## 2013-02-19 LAB — COMPREHENSIVE METABOLIC PANEL
ALT: 16 U/L (ref 0–35)
AST: 16 U/L (ref 0–37)
Albumin: 3.7 g/dL (ref 3.5–5.2)
Alkaline Phosphatase: 68 U/L (ref 39–117)
Chloride: 101 mEq/L (ref 96–112)
Potassium: 3.6 mEq/L (ref 3.5–5.1)
Total Bilirubin: 0.2 mg/dL — ABNORMAL LOW (ref 0.3–1.2)
Total Protein: 7.5 g/dL (ref 6.0–8.3)

## 2013-02-19 LAB — CBC WITH DIFFERENTIAL/PLATELET
Basophils Absolute: 0.1 10*3/uL (ref 0.0–0.1)
Basophils Relative: 1 % (ref 0–1)
Eosinophils Absolute: 0.3 10*3/uL (ref 0.0–0.7)
HCT: 35.6 % — ABNORMAL LOW (ref 36.0–46.0)
MCHC: 34 g/dL (ref 30.0–36.0)
Neutro Abs: 6.4 10*3/uL (ref 1.7–7.7)
Neutrophils Relative %: 59 % (ref 43–77)
Platelets: 371 10*3/uL (ref 150–400)
RDW: 13.8 % (ref 11.5–15.5)

## 2013-02-19 LAB — URINALYSIS, ROUTINE W REFLEX MICROSCOPIC
Bilirubin Urine: NEGATIVE
Hgb urine dipstick: NEGATIVE
Leukocytes, UA: NEGATIVE
Nitrite: NEGATIVE
Specific Gravity, Urine: 1.021 (ref 1.005–1.030)
Urobilinogen, UA: 0.2 mg/dL (ref 0.0–1.0)
pH: 5 (ref 5.0–8.0)

## 2013-02-19 LAB — POCT I-STAT TROPONIN I

## 2013-02-19 MED ORDER — TRAMADOL HCL 50 MG PO TABS: 50.0000 mg | ORAL_TABLET | Freq: Four times a day (QID) | ORAL | Status: DC | PRN

## 2013-02-19 MED ORDER — ONDANSETRON 4 MG PO TBDP
8.0000 mg | ORAL_TABLET | Freq: Once | ORAL | Status: AC
Start: 2013-02-19 — End: 2013-02-19
  Administered 2013-02-19: 8 mg via ORAL
  Filled 2013-02-19: qty 2

## 2013-02-19 MED ORDER — HYDROMORPHONE HCL 2 MG PO TABS: 2.0000 mg | ORAL_TABLET | ORAL | Status: DC | PRN

## 2013-02-19 MED ORDER — TRAMADOL HCL 50 MG PO TABS
50.0000 mg | ORAL_TABLET | Freq: Once | ORAL | Status: AC
  Administered 2013-02-19: 50 mg via ORAL
  Filled 2013-02-19: qty 1

## 2013-02-19 MED ORDER — ONDANSETRON 4 MG PO TBDP: 4.0000 mg | ORAL_TABLET | Freq: Three times a day (TID) | ORAL | Status: DC | PRN

## 2013-02-19 NOTE — ED Notes (Signed)
Pt states that she has been having a rapid heartrate/palpations all day. Pt states this afternoon her right shoulder to her elbow has been hurting, increases when she turns her head to the right. Pt states that she is not having CP just the right arm pain. Pt states that she has not been able to afford her DM medication after her move. Pt recently changed to Lisinopril for BP (has been taking that medication) pt states fatigue as well today.

## 2013-02-19 NOTE — ED Provider Notes (Signed)
CSN: [id number]     Arrival date & time 02/19/13  1952 History   First MD Initiated Contact with Patient 02/19/13 2025     Chief Complaint  Patient presents with  . Irregular Heart Beat   (Consider location/radiation/quality/duration/timing/severity/associated sxs/prior Treatment) The history is provided by the patient.   67 year old female woke this morning with some stiffness to the right side of the neck that would go to the right shoulder made worse by moving her neck. Patient started taking Benadryl for a typical itchy rash that she gets on her hands yesterday. Patient with some heart palpitations this morning intermittently throughout the day no chest pain. The neck pain is 6/10 made worse with movement. Does radiate towards the right shoulder. Not associated with nausea vomiting or shortness of breath. No anterior chest pain. The neck pain is described as an 8 sometimes get sharp.  Past Medical History  Diagnosis Date  . Hypertension   . Diabetes mellitus   . Cataract    Past Surgical History  Procedure Laterality Date  . Abdominal hysterectomy    . Cholecystectomy     History reviewed. No pertinent family history. History  Substance Use Topics  . Smoking status: Never Smoker   . Smokeless tobacco: Not on file  . Alcohol Use: No   OB History   Grav Para Term Preterm Abortions TAB SAB Ect Mult Living                 Review of Systems  Constitutional: Negative for fever.  HENT: Negative for congestion.   Eyes: Negative for visual disturbance.  Respiratory: Negative for shortness of breath.   Cardiovascular: Positive for palpitations. Negative for chest pain and leg swelling.  Gastrointestinal: Negative for abdominal pain.  Genitourinary: Positive for dysuria.  Musculoskeletal: Positive for neck pain. Negative for back pain.  Skin: Negative for rash.  Neurological: Negative for headaches.  Hematological: Does not bruise/bleed easily.  Psychiatric/Behavioral:  Negative for confusion.    Allergies  Codeine and Penicillins  Home Medications   Current Outpatient Rx  Name  Route  Sig  Dispense  Refill  . Aspirin-Salicylamide-Caffeine (BC HEADACHE POWDER PO)   Oral   Take 1 packet by mouth 2 (two) times daily as needed. For headache         . cyanocobalamin 1000 MCG tablet   Oral   Take 1,000 mcg by mouth daily.          . diphenhydrAMINE (BENADRYL) 25 MG tablet   Oral   Take 1 tablet (25 mg total) by mouth every 6 (six) hours.   20 tablet   0   . lisinopril (PRINIVIL,ZESTRIL) 30 MG tablet   Oral   Take 30 mg by mouth daily.         . metFORMIN (GLUCOPHAGE) 1000 MG tablet   Oral   Take 500 mg by mouth 2 (two) times daily with a meal.          . PARoxetine (PAXIL) 20 MG tablet   Oral   Take 20 mg by mouth every morning.         . triamterene-hydrochlorothiazide (MAXZIDE-25) 37.5-25 MG per tablet   Oral   Take 1 tablet by mouth daily.         Marland Kitchen EPINEPHrine (EPI-PEN) 0.3 mg/0.3 mL DEVI   Intramuscular   Inject 0.3 mLs (0.3 mg total) into the muscle once. Then proceed to the nearest ER   1 Device   2   .  HYDROmorphone (DILAUDID) 2 MG tablet   Oral   Take 1 tablet (2 mg total) by mouth every 4 (four) hours as needed for pain.   20 tablet   0   . traMADol (ULTRAM) 50 MG tablet   Oral   Take 1 tablet (50 mg total) by mouth every 6 (six) hours as needed for pain.   15 tablet   0    BP 133/68  Pulse 88  Temp(Src) 98.1 F (36.7 C) (Oral)  Resp 21  Ht 5\' 4"  (1.626 m)  Wt 177 lb (80.287 kg)  BMI 30.37 kg/m2  SpO2 97% Physical Exam  Nursing note and vitals reviewed. Constitutional: She is oriented to person, place, and time. She appears well-developed and well-nourished. No distress.  HENT:  Head: Normocephalic and atraumatic.  Mouth/Throat: Oropharynx is clear and moist.  Eyes: Conjunctivae and EOM are normal. Pupils are equal, round, and reactive to light.  Neck: Normal range of motion. Neck supple.   Palpation to the right side of the neck without any cystic muscle spasm. Patient does have some palpable tenderness in that area no cervical adenopathy. Increased with range of motion. Chest right lateral.  Cardiovascular: Normal rate, regular rhythm and normal heart sounds.   No murmur heard. Pulmonary/Chest: Effort normal and breath sounds normal. No respiratory distress. She has no wheezes. She has no rales.  Abdominal: Soft. Bowel sounds are normal. There is no tenderness.  Musculoskeletal: Normal range of motion. She exhibits no edema.  Lymphadenopathy:    She has no cervical adenopathy.  Neurological: She is alert and oriented to person, place, and time. No cranial nerve deficit. She exhibits normal muscle tone. Coordination normal.  Skin: Skin is warm. No rash noted.    ED Course  Procedures (including critical care time) Labs Review Labs Reviewed  CBC WITH DIFFERENTIAL - Abnormal; Notable for the following:    WBC 10.8 (*)    HCT 35.6 (*)    All other components within normal limits  COMPREHENSIVE METABOLIC PANEL - Abnormal; Notable for the following:    Glucose, Bld 109 (*)    Total Bilirubin 0.2 (*)    GFR calc non Af Amer 62 (*)    GFR calc Af Amer 72 (*)    All other components within normal limits  URINALYSIS, ROUTINE W REFLEX MICROSCOPIC - Abnormal; Notable for the following:    Ketones, ur 15 (*)    All other components within normal limits  POCT I-STAT TROPONIN I   Results for orders placed during the hospital encounter of 02/19/13  CBC WITH DIFFERENTIAL      Result Value Range   WBC 10.8 (*) 4.0 - 10.5 K/uL   RBC 4.17  3.87 - 5.11 MIL/uL   Hemoglobin 12.1  12.0 - 15.0 g/dL   HCT 16.1 (*) 09.6 - 04.5 %   MCV 85.4  78.0 - 100.0 fL   MCH 29.0  26.0 - 34.0 pg   MCHC 34.0  30.0 - 36.0 g/dL   RDW 40.9  81.1 - 91.4 %   Platelets 371  150 - 400 K/uL   Neutrophils Relative % 59  43 - 77 %   Neutro Abs 6.4  1.7 - 7.7 K/uL   Lymphocytes Relative 31  12 - 46 %    Lymphs Abs 3.4  0.7 - 4.0 K/uL   Monocytes Relative 6  3 - 12 %   Monocytes Absolute 0.6  0.1 - 1.0 K/uL   Eosinophils Relative 3  0 - 5 %   Eosinophils Absolute 0.3  0.0 - 0.7 K/uL   Basophils Relative 1  0 - 1 %   Basophils Absolute 0.1  0.0 - 0.1 K/uL  COMPREHENSIVE METABOLIC PANEL      Result Value Range   Sodium 139  135 - 145 mEq/L   Potassium 3.6  3.5 - 5.1 mEq/L   Chloride 101  96 - 112 mEq/L   CO2 23  19 - 32 mEq/L   Glucose, Bld 109 (*) 70 - 99 mg/dL   BUN 22  6 - 23 mg/dL   Creatinine, Ser 5.28  0.50 - 1.10 mg/dL   Calcium 9.8  8.4 - 41.3 mg/dL   Total Protein 7.5  6.0 - 8.3 g/dL   Albumin 3.7  3.5 - 5.2 g/dL   AST 16  0 - 37 U/L   ALT 16  0 - 35 U/L   Alkaline Phosphatase 68  39 - 117 U/L   Total Bilirubin 0.2 (*) 0.3 - 1.2 mg/dL   GFR calc non Af Amer 62 (*) >90 mL/min   GFR calc Af Amer 72 (*) >90 mL/min  URINALYSIS, ROUTINE W REFLEX MICROSCOPIC      Result Value Range   Color, Urine YELLOW  YELLOW   APPearance CLEAR  CLEAR   Specific Gravity, Urine 1.021  1.005 - 1.030   pH 5.0  5.0 - 8.0   Glucose, UA NEGATIVE  NEGATIVE mg/dL   Hgb urine dipstick NEGATIVE  NEGATIVE   Bilirubin Urine NEGATIVE  NEGATIVE   Ketones, ur 15 (*) NEGATIVE mg/dL   Protein, ur NEGATIVE  NEGATIVE mg/dL   Urobilinogen, UA 0.2  0.0 - 1.0 mg/dL   Nitrite NEGATIVE  NEGATIVE   Leukocytes, UA NEGATIVE  NEGATIVE  POCT I-STAT TROPONIN I      Result Value Range   Troponin i, poc 0.00  0.00 - 0.08 ng/mL   Comment 3             Imaging Review Dg Chest 2 View  02/19/2013   CLINICAL DATA:  Tachycardia with arm pain  EXAM: CHEST  2 VIEW  COMPARISON:  08/11/2005.  FINDINGS: The heart size and mediastinal contours are within normal limits. Both lungs are clear. The visualized skeletal structures are unremarkable.  IMPRESSION: No active cardiopulmonary disease.   Electronically Signed   By: Alcide Clever M.D.   On: 02/19/2013 20:53    EKG Interpretation     Ventricular Rate:  102 PR  Interval:  142 QRS Duration: 78 QT Interval:  348 QTC Calculation: 453 R Axis:   -19 Text Interpretation:  Sinus tachycardia with Premature atrial complexes with Abberant conduction Nonspecific ST abnormality Abnormal ECG No significant change since last tracing           patient with occasional premature atrial contractions today that is new compared to her old EKG.   MDM   1. Heart palpitations   2. Neck pain on right side    Patient with 2 specific complaints. One was heart palpitations patient has been taking Benadryl for some itching on her hands is possible this could be contributing to that. Cardiac monitoring here without any significant abnormalities other than occasional premature atrial contraction. Workup for the palpitations was negative. No evidence of acute cardiac event. Patient's heart rate of 108 initially improved to an the low 100 here. No hypoxia. No fever. Patient with complaint of right-sided neck pain that is worse with movement very consistent  with neck was skeletal. Patient treated with tramadol in the emergency apartment tolerated fine will be discharged home with tramadol and hydromorphone for the neck pain. Patient does have a diabetic Dr. does not have a primary care doctor. Resource guide provided. Patient did have some complaint of some dysuria urinalysis was negative for urinary tract infection. Chest x-ray was negative. Chest x-ray was negative for pneumonia pulmonary edema or pneumothorax. Clinically not concerned about pulmonary embolus.    Shelda Jakes, MD 02/19/13 [phone]

## 2013-02-19 NOTE — ED Notes (Signed)
Pt actively vomiting in the trash can.  Yellow emesis noted.  No new complaints.

## 2013-02-19 NOTE — ED Notes (Addendum)
Pt reports awakening with right sided neck pain this  AM. States as day progressed, pain started to radiated to right arm. Denies any numbness/tingling, but states that pain increases with movement of right arm. Denies CP, SOB, nausea, diaphoresis or weakness. Pt does report episodes of increased, irregular heart rate today, lasting appx 10 minutes. State they occur at rest and with exertion. Pt is also currently out of DM Rx, and running low on labetalol.

## 2013-02-19 NOTE — ED Notes (Signed)
Md at bedside

## 2013-02-19 NOTE — ED Notes (Signed)
Old and new given to Dr Deretha Emory

## 2013-02-19 NOTE — ED Notes (Signed)
Pt given sandwich and sprite.  

## 2013-02-20 NOTE — ED Notes (Signed)
Pt sts her nausea has improved with Zofran.

## 2013-03-04 ENCOUNTER — Ambulatory Visit: Payer: Self-pay | Admitting: Internal Medicine

## 2013-03-04 ENCOUNTER — Telehealth: Payer: Self-pay

## 2013-03-04 NOTE — Telephone Encounter (Signed)
The patient called hoping to get a refill on her test strips and lancets to the [facility] on [address].  Her callback number is [phone]

## 2013-03-05 ENCOUNTER — Other Ambulatory Visit: Payer: Self-pay | Admitting: *Deleted

## 2013-03-05 MED ORDER — GLUCOSE BLOOD VI STRP: ORAL_STRIP | Status: DC

## 2013-03-05 MED ORDER — ACCU-CHEK SOFTCLIX LANCETS MISC: Status: AC

## 2013-03-25 ENCOUNTER — Ambulatory Visit: Payer: Medicare HMO | Admitting: Internal Medicine

## 2013-04-02 ENCOUNTER — Other Ambulatory Visit: Payer: Self-pay | Admitting: *Deleted

## 2013-04-19 ENCOUNTER — Other Ambulatory Visit: Payer: Self-pay | Admitting: Internal Medicine

## 2013-04-23 ENCOUNTER — Other Ambulatory Visit: Payer: Self-pay | Admitting: Internal Medicine

## 2013-05-03 ENCOUNTER — Ambulatory Visit: Payer: Self-pay | Admitting: Internal Medicine

## 2013-05-03 HISTORY — PX: CATARACT EXTRACTION: SUR2

## 2013-05-13 ENCOUNTER — Telehealth: Payer: Self-pay | Admitting: *Deleted

## 2013-05-13 NOTE — Telephone Encounter (Signed)
Pt called and lvm stating that she is having cataract surgery on January 30th. Pt stated that she needs a clearance for surgery letter/note sent to her eye dr at [phone]#[phone]. Please advise.

## 2013-05-13 NOTE — Telephone Encounter (Signed)
Scheduled f/up appt with pt for Friday, Jan. 23rd at 10:00 am. Be advised.

## 2013-05-13 NOTE — Telephone Encounter (Signed)
Will need to see her first - last OV 4 mo ago.

## 2013-05-14 ENCOUNTER — Encounter: Payer: Self-pay | Admitting: Internal Medicine

## 2013-05-14 ENCOUNTER — Ambulatory Visit (INDEPENDENT_AMBULATORY_CARE_PROVIDER_SITE_OTHER): Payer: Medicare HMO | Admitting: Internal Medicine

## 2013-05-14 VITALS — BP 108/68 | HR 91 | Temp 98.2°F | Resp 12 | Wt 174.2 lb

## 2013-05-14 DIAGNOSIS — E119 Type 2 diabetes mellitus without complications: Secondary | ICD-10-CM

## 2013-05-14 LAB — HEMOGLOBIN A1C: Hgb A1c MFr Bld: 6.4 % (ref 4.6–6.5)

## 2013-05-14 NOTE — Progress Notes (Signed)
Patient ID: Alison [name], female   DOB: [birth year], 68 y.o.   MRN: [id number] HPI: Alison [name] is a 68 y.o.-year-old female, self-referred for management of DM2, dx 2004, non-insulin-dependent, uncontrolled, without complications. Last visit 4 mo. She missed the last appt. She needs clearance for cataract surgery.  Last hemoglobin A1c was: Lab Results  Component Value Date   HGBA1C 6.3 12/22/2012   HGBA1C 8.2* 09/21/2012   HGBA1C 7.3* 05/02/2008   Pt is on a regimen of: - Metformin 1000 mg po in am and 500 mg at dinnertime - Januvia 50 mg >> started at last visit instead of Actos (1/2 dose 2/2 kidney dysfxn) She was on Actos 30 mg daily - started 08/2012, stopped 12/2012. She wasworried about Actos and bladder ca.  Pt checks her sugars 2x a week (no log, no meter, and cannot remember sugars very well) and they are: - am: 105-133 in last 2 weeks >> less than 100 >> 100-110 - 2h after b'fast: maybe 110-117 -after dinner: 95-128 >> 90-100 >> 69-150, mostly low 100s No lows. Lowest sugar was 69, had hypoglycemia awareness. Highest sugar in last 3 months 205.  Pt does not have chronic kidney disease, last BUN/creatinine was: Lab Results  Component Value Date   BUN 22 02/19/2013   CREATININE 0.94 02/19/2013  She is on Lisinopril.  Pt's last eye exam was in 10/2012. Has cataracts - ? DR. Had L cataract sx on 05/03/2013, will have the R cataract surgery soon (05/21/2013).  Denies numbness and tingling in her legs.  I reviewed pt's medications, allergies, PMH, social hx, family hx and no changes required, except as mentioned above.  ROS: Constitutional: no weight gain/loss, no fatigue, no subjective hyperthermia/hypothermia Eyes: no blurry vision, no xerophthalmia ENT: no sore throat, no nodules palpated in throat, no dysphagia/odynophagia, no hoarseness Cardiovascular: no CP/SOB/palpitations/leg swelling Respiratory: no cough/SOB Gastrointestinal: no N/V/D/C Musculoskeletal: no  muscle/joint aches Skin: no rashes  PE: BP 108/68  Pulse 91  Temp(Src) 98.2 F (36.8 C) (Oral)  Resp 12  Wt 174 lb 3.2 oz (79.017 kg)  SpO2 97% Wt Readings from Last 3 Encounters:  05/14/13 174 lb 3.2 oz (79.017 kg)  02/19/13 177 lb (80.287 kg)  12/22/12 183 lb (83.008 kg)   Constitutional: overweight, in NAD Eyes: PERRLA, EOMI, no exophthalmos;  ENT: moist mucous membranes, no thyromegaly, no cervical lymphadenopathy Cardiovascular: RRR, No MRG Respiratory: CTA B Gastrointestinal: abdomen soft, NT, ND, BS+ Musculoskeletal: no deformities, strength intact in all 4 Skin: moist, warm, no rashes  ASSESSMENT: 1. DM2, non-insulin-dependent, uncontrolled, without complications  PLAN:  1. Patient with long-standing mild diabetes, with sugars at goal for review of her CBG log. Underlined the need to bring sugar log at next visit. - I advised her to continue Metformin 1500 mg daily. - continue Januvia 50 mg daily - we have room to increase to 100 mg as her kidney fxn normalized - we'll check a hemoglobin A1c today - given sugar log and advised how to fill it and to bring it at next appt  - Check sugars 1 x a day, rotating checks  Office Visit on 05/14/2013  Component Date Value Range Status  . Hemoglobin A1C 05/14/2013 6.4  4.6 - 6.5 % Final   Glycemic Control Guidelines for People with Diabetes:Non Diabetic:  <6%Goal of Therapy: <7%Additional Action Suggested:  >8%    She is cleared for surgery from DM point of view.  PCP: Dr Dorris Fetch at the [facility] (Phone: #[phone] Fax#[phone])

## 2013-05-14 NOTE — Patient Instructions (Signed)
Please stop at the lab. Please bring your sugar log at next visit. Stay on the same diabetes regimen for now. Please return in 3 months  You are cleared for cataract surgery from your diabetes point of view.

## 2013-06-01 ENCOUNTER — Other Ambulatory Visit: Payer: Self-pay | Admitting: *Deleted

## 2013-06-01 MED ORDER — ACCU-CHEK NANO SMARTVIEW W/DEVICE KIT: PACK | Status: AC

## 2013-06-03 ENCOUNTER — Other Ambulatory Visit: Payer: Self-pay | Admitting: *Deleted

## 2013-06-03 MED ORDER — GLUCOSE BLOOD VI STRP: ORAL_STRIP | Status: AC

## 2013-06-21 ENCOUNTER — Other Ambulatory Visit: Payer: Self-pay | Admitting: *Deleted

## 2013-06-21 MED ORDER — METFORMIN HCL 1000 MG PO TABS: ORAL_TABLET | ORAL | Status: DC

## 2013-06-21 NOTE — Telephone Encounter (Signed)
Pt called and lvm requesting refill of her metformin. Called pt back and advised her that the instructions on dosage changed. Pt understood.

## 2013-06-25 ENCOUNTER — Other Ambulatory Visit: Payer: Self-pay | Admitting: *Deleted

## 2013-06-25 ENCOUNTER — Telehealth: Payer: Self-pay | Admitting: Internal Medicine

## 2013-06-25 MED ORDER — TRIAMTERENE-HCTZ 37.5-25 MG PO TABS: 1.0000 | ORAL_TABLET | Freq: Every day | ORAL | Status: DC

## 2013-06-25 NOTE — Telephone Encounter (Signed)
Pt states she needs her rx refilled  Triamterene HCTZ   [facility]arm[facility] [address]   Call back: [phone]  Thank You:)

## 2013-08-12 ENCOUNTER — Ambulatory Visit: Payer: Medicare HMO | Admitting: Internal Medicine

## 2013-08-17 ENCOUNTER — Encounter: Payer: Self-pay | Admitting: Gastroenterology

## 2013-09-15 ENCOUNTER — Ambulatory Visit (AMBULATORY_SURGERY_CENTER): Payer: Self-pay | Admitting: *Deleted

## 2013-09-15 VITALS — Ht 64.0 in | Wt 179.2 lb

## 2013-09-15 DIAGNOSIS — Z1211 Encounter for screening for malignant neoplasm of colon: Secondary | ICD-10-CM

## 2013-09-15 MED ORDER — MOVIPREP 100 G PO SOLR: ORAL | Status: DC

## 2013-09-15 NOTE — Progress Notes (Signed)
No allergies to eggs or soy. No problems with anesthesia.  Pt not given Emmi instructions for colonoscopy.  No computer access  No oxygen use  No diet drug use  Colonoscopy 14 years ago.  Does not know where

## 2013-09-29 ENCOUNTER — Encounter: Payer: Self-pay | Admitting: Gastroenterology

## 2013-09-29 ENCOUNTER — Ambulatory Visit (AMBULATORY_SURGERY_CENTER): Payer: Medicare HMO | Admitting: Gastroenterology

## 2013-09-29 VITALS — BP 134/69 | HR 75 | Temp 97.4°F | Resp 16 | Ht 64.0 in | Wt 180.0 lb

## 2013-09-29 DIAGNOSIS — K5730 Diverticulosis of large intestine without perforation or abscess without bleeding: Secondary | ICD-10-CM

## 2013-09-29 DIAGNOSIS — Z1211 Encounter for screening for malignant neoplasm of colon: Secondary | ICD-10-CM

## 2013-09-29 LAB — GLUCOSE, CAPILLARY
Glucose-Capillary: 88 mg/dL (ref 70–99)
Glucose-Capillary: 89 mg/dL (ref 70–99)

## 2013-09-29 MED ORDER — SODIUM CHLORIDE 0.9 % IV SOLN: 500.0000 mL | INTRAVENOUS | Status: DC

## 2013-09-29 NOTE — Op Note (Signed)
 [facility] [address] [place], [address]   COLONOSCOPY PROCEDURE REPORT  PATIENT: Alison [name], Alison [name]  MR#: [id number] BIRTHDATE: [birth year] , 67  yrs. old GENDER: Female ENDOSCOPIST: Rachael Fee, MD REFERRED KA:JGOTL Concepcion Elk, M.D. PROCEDURE DATE:  09/29/2013 PROCEDURE:   Colonoscopy, screening First Screening Colonoscopy - Avg.  risk and is 50 yrs.  old or older - No.  Prior Negative Screening - Now for repeat screening. 10 or more years since last screening  History of Adenoma - Now for follow-up colonoscopy & has been > or = to 3 yrs.  N/A  Polyps Removed Today? No.  Recommend repeat exam, <10 yrs? No. ASA CLASS:   Class II INDICATIONS:average risk screening. MEDICATIONS: MAC sedation, administered by CRNA and propofol (Diprivan) 300mg  IV  DESCRIPTION OF PROCEDURE:   After the risks benefits and alternatives of the procedure were thoroughly explained, informed consent was obtained.  A digital rectal exam revealed no abnormalities of the rectum.   The LB [id number]  endoscope was introduced through the anus and advanced to the cecum, which was identified by both the appendix and ileocecal valve. No adverse events experienced.   The quality of the prep was excellent.  The instrument was then slowly withdrawn as the colon was fully examined.   COLON FINDINGS: There were a few small diverticulum in the left colon.  The examination was otherwise normal.  Retroflexed views revealed no abnormalities. The time to cecum=7 minutes 55 seconds. Withdrawal time=6 minutes 25 seconds.  The scope was withdrawn and the procedure completed. COMPLICATIONS: There were no complications.  ENDOSCOPIC IMPRESSION: There were a few small diverticulum in the left colon. The examination was otherwise normal.  No polyps or cancers  RECOMMENDATIONS: You should continue to follow colorectal cancer screening guidelines for "routine risk" patients with a repeat colonoscopy  in 10 years.    eSigned:  Rachael Fee, MD 09/29/2013 10:21 AM

## 2013-09-29 NOTE — Patient Instructions (Signed)

## 2013-09-30 ENCOUNTER — Telehealth: Payer: Self-pay

## 2013-09-30 NOTE — Telephone Encounter (Signed)
  Follow up Call-  Call back number 09/29/2013  Post procedure Call Back phone  # [phone]  Permission to leave phone message Yes     Patient questions:  Do you have a fever, pain , or abdominal swelling? no Pain Score  0 *  Have you tolerated food without any problems? yes  Have you been able to return to your normal activities? yes  Do you have any questions about your discharge instructions: Diet   no Medications  no Follow up visit  no  Do you have questions or concerns about your Care? no  Actions: * If pain score is 4 or above: No action needed, pain <4.

## 2013-11-05 ENCOUNTER — Telehealth: Payer: Self-pay

## 2013-11-05 DIAGNOSIS — E119 Type 2 diabetes mellitus without complications: Secondary | ICD-10-CM

## 2013-11-05 NOTE — Telephone Encounter (Signed)
Contacted pt, scheduled a1c follow up with  Alison LennoxGherghe, MD. Ordered labs per protocol

## 2013-11-23 ENCOUNTER — Ambulatory Visit: Payer: Medicare HMO

## 2013-12-13 ENCOUNTER — Telehealth: Payer: Self-pay | Admitting: *Deleted

## 2013-12-13 DIAGNOSIS — E119 Type 2 diabetes mellitus without complications: Secondary | ICD-10-CM

## 2013-12-13 NOTE — Telephone Encounter (Signed)
Left message on machine for patient to schedule an appointment for diabetes Lipid ordered Diabetic bundle

## 2014-01-11 ENCOUNTER — Other Ambulatory Visit: Payer: Self-pay | Admitting: Internal Medicine

## 2014-01-12 ENCOUNTER — Other Ambulatory Visit: Payer: Self-pay | Admitting: Internal Medicine

## 2014-01-13 ENCOUNTER — Other Ambulatory Visit: Payer: Self-pay | Admitting: *Deleted

## 2014-01-13 NOTE — Telephone Encounter (Signed)
Opened encounter in error  

## 2014-05-25 ENCOUNTER — Other Ambulatory Visit (HOSPITAL_COMMUNITY): Payer: Self-pay | Admitting: Internal Medicine

## 2014-05-25 ENCOUNTER — Ambulatory Visit (HOSPITAL_COMMUNITY)
Admission: RE | Admit: 2014-05-25 | Discharge: 2014-05-25 | Disposition: A | Discharge status: Home / Self Care | Payer: Commercial Managed Care - HMO | Source: Ambulatory Visit | Attending: Internal Medicine | Admitting: Internal Medicine

## 2014-05-25 DIAGNOSIS — M545 Low back pain: Secondary | ICD-10-CM | POA: Insufficient documentation

## 2014-05-25 DIAGNOSIS — M479 Spondylosis, unspecified: Secondary | ICD-10-CM | POA: Insufficient documentation

## 2014-05-25 DIAGNOSIS — R52 Pain, unspecified: Secondary | ICD-10-CM

## 2015-02-08 ENCOUNTER — Emergency Department (HOSPITAL_COMMUNITY)
Admission: EM | Admit: 2015-02-08 | Discharge: 2015-02-08 | Disposition: A | Discharge status: Home / Self Care | Payer: Commercial Managed Care - HMO | Attending: Emergency Medicine | Admitting: Emergency Medicine

## 2015-02-08 ENCOUNTER — Encounter (HOSPITAL_COMMUNITY): Payer: Self-pay | Admitting: Emergency Medicine

## 2015-02-08 DIAGNOSIS — R110 Nausea: Secondary | ICD-10-CM | POA: Diagnosis not present

## 2015-02-08 DIAGNOSIS — L299 Pruritus, unspecified: Secondary | ICD-10-CM | POA: Diagnosis present

## 2015-02-08 DIAGNOSIS — E119 Type 2 diabetes mellitus without complications: Secondary | ICD-10-CM | POA: Insufficient documentation

## 2015-02-08 DIAGNOSIS — E876 Hypokalemia: Secondary | ICD-10-CM | POA: Diagnosis not present

## 2015-02-08 DIAGNOSIS — Z7982 Long term (current) use of aspirin: Secondary | ICD-10-CM | POA: Diagnosis not present

## 2015-02-08 DIAGNOSIS — F419 Anxiety disorder, unspecified: Secondary | ICD-10-CM | POA: Insufficient documentation

## 2015-02-08 DIAGNOSIS — Y998 Other external cause status: Secondary | ICD-10-CM | POA: Diagnosis not present

## 2015-02-08 DIAGNOSIS — Y9289 Other specified places as the place of occurrence of the external cause: Secondary | ICD-10-CM | POA: Insufficient documentation

## 2015-02-08 DIAGNOSIS — F329 Major depressive disorder, single episode, unspecified: Secondary | ICD-10-CM | POA: Diagnosis not present

## 2015-02-08 DIAGNOSIS — R42 Dizziness and giddiness: Secondary | ICD-10-CM | POA: Diagnosis not present

## 2015-02-08 DIAGNOSIS — I10 Essential (primary) hypertension: Secondary | ICD-10-CM | POA: Insufficient documentation

## 2015-02-08 DIAGNOSIS — Y9389 Activity, other specified: Secondary | ICD-10-CM | POA: Insufficient documentation

## 2015-02-08 DIAGNOSIS — H269 Unspecified cataract: Secondary | ICD-10-CM | POA: Insufficient documentation

## 2015-02-08 DIAGNOSIS — X58XXXA Exposure to other specified factors, initial encounter: Secondary | ICD-10-CM | POA: Diagnosis not present

## 2015-02-08 DIAGNOSIS — T7840XA Allergy, unspecified, initial encounter: Secondary | ICD-10-CM | POA: Diagnosis not present

## 2015-02-08 DIAGNOSIS — Z880 Allergy status to penicillin: Secondary | ICD-10-CM | POA: Insufficient documentation

## 2015-02-08 DIAGNOSIS — Z79899 Other long term (current) drug therapy: Secondary | ICD-10-CM | POA: Insufficient documentation

## 2015-02-08 DIAGNOSIS — R55 Syncope and collapse: Secondary | ICD-10-CM

## 2015-02-08 LAB — CBC WITH DIFFERENTIAL/PLATELET
BASOS ABS: 0 10*3/uL (ref 0.0–0.1)
BASOS PCT: 0 %
EOS ABS: 0.2 10*3/uL (ref 0.0–0.7)
Eosinophils Relative: 4 %
HCT: 36.4 % (ref 36.0–46.0)
Hemoglobin: 11.6 g/dL — ABNORMAL LOW (ref 12.0–15.0)
Lymphocytes Relative: 40 %
Lymphs Abs: 2 10*3/uL (ref 0.7–4.0)
MCH: 27.4 pg (ref 26.0–34.0)
MCHC: 31.9 g/dL (ref 30.0–36.0)
MCV: 86.1 fL (ref 78.0–100.0)
Monocytes Absolute: 0.2 10*3/uL (ref 0.1–1.0)
Monocytes Relative: 4 %
Neutro Abs: 2.6 10*3/uL (ref 1.7–7.7)
Neutrophils Relative %: 52 %
PLATELETS: 311 10*3/uL (ref 150–400)
RBC: 4.23 MIL/uL (ref 3.87–5.11)
RDW: 14 % (ref 11.5–15.5)
WBC: 5 10*3/uL (ref 4.0–10.5)

## 2015-02-08 LAB — COMPREHENSIVE METABOLIC PANEL
ALT: 16 U/L (ref 14–54)
AST: 20 U/L (ref 15–41)
Albumin: 3.5 g/dL (ref 3.5–5.0)
Alkaline Phosphatase: 49 U/L (ref 38–126)
Anion gap: 11 (ref 5–15)
BUN: 16 mg/dL (ref 6–20)
CHLORIDE: 102 mmol/L (ref 101–111)
CO2: 25 mmol/L (ref 22–32)
CREATININE: 0.89 mg/dL (ref 0.44–1.00)
Calcium: 9.1 mg/dL (ref 8.9–10.3)
GFR calc Af Amer: >60 mL/min (ref >60)
Glucose, Bld: 145 mg/dL — ABNORMAL HIGH (ref 65–99)
POTASSIUM: 2.9 mmol/L — AB (ref 3.5–5.1)
Sodium: 138 mmol/L (ref 135–145)
TOTAL PROTEIN: 6.4 g/dL — AB (ref 6.5–8.1)
Total Bilirubin: 0.4 mg/dL (ref 0.3–1.2)

## 2015-02-08 LAB — TROPONIN I

## 2015-02-08 MED ORDER — FAMOTIDINE 20 MG PO TABS
20.0000 mg | ORAL_TABLET | Freq: Once | ORAL | Status: AC
  Administered 2015-02-08: 20 mg via ORAL
  Filled 2015-02-08: qty 1

## 2015-02-08 MED ORDER — METHYLPREDNISOLONE SODIUM SUCC 125 MG IJ SOLR
125.0000 mg | Freq: Once | INTRAMUSCULAR | Status: AC
  Administered 2015-02-08: 125 mg via INTRAVENOUS
  Filled 2015-02-08: qty 2

## 2015-02-08 MED ORDER — PREDNISONE 20 MG PO TABS: ORAL_TABLET | ORAL | Status: DC

## 2015-02-08 MED ORDER — POTASSIUM CHLORIDE CRYS ER 20 MEQ PO TBCR
40.0000 meq | EXTENDED_RELEASE_TABLET | Freq: Once | ORAL | Status: AC
  Administered 2015-02-08: 40 meq via ORAL
  Filled 2015-02-08: qty 2

## 2015-02-08 NOTE — Discharge Instructions (Signed)
Allergies An allergy is an abnormal reaction to a substance by the body's defense system (immune system). Allergies can develop at any age. WHAT CAUSES ALLERGIES? An allergic reaction happens when the immune system mistakenly reacts to a normally harmless substance, called an allergen, as if it were harmful. The immune system releases antibodies to fight the substance. Antibodies eventually release a chemical called histamine into the bloodstream. The release of histamine is meant to protect the body from infection, but it also causes discomfort. An allergic reaction can be triggered by:  Eating an allergen.  Inhaling an allergen.  Touching an allergen. WHAT TYPES OF ALLERGIES ARE THERE? There are many types of allergies. Common types include:  Seasonal allergies. People with this type of allergy are usually allergic to substances that are only present during certain seasons, such as molds and pollens.  Food allergies.  Drug allergies.  Insect allergies.  Animal dander allergies. WHAT ARE SYMPTOMS OF ALLERGIES? Possible allergy symptoms include:  Swelling of the lips, face, tongue, mouth, or throat.  Sneezing, coughing, or wheezing.  Nasal congestion.  Tingling in the mouth.  Rash.  Itching.  Itchy, red, swollen areas of skin (hives).  Watery eyes.  Vomiting.  Diarrhea.  Dizziness.  Lightheadedness.  Fainting.  Trouble breathing or swallowing.  Chest tightness.  Rapid heartbeat. HOW ARE ALLERGIES DIAGNOSED? Allergies are diagnosed with a medical and family history and one or more of the following:  Skin tests.  Blood tests.  A food diary. A food diary is a record of all the foods and drinks you have in a day and of all the symptoms you experience.  The results of an elimination diet. An elimination diet involves eliminating foods from your diet and then adding them back in one by one to find out if a certain food causes an allergic reaction. HOW ARE  ALLERGIES TREATED? There is no cure for allergies, but allergic reactions can be treated with medicine. Severe reactions usually need to be treated at a hospital. HOW CAN REACTIONS BE PREVENTED? The best way to prevent an allergic reaction is by avoiding the substance you are allergic to. Allergy shots and medicines can also help prevent reactions in some cases. People with severe allergic reactions may be able to prevent a life-threatening reaction called anaphylaxis with a medicine given right after exposure to the allergen.   This information is not intended to replace advice given to you by your health care provider. Make sure you discuss any questions you have with your health care provider.   Document Released: 07/02/2002 Document Revised: 04/29/2014 Document Reviewed: 01/18/2014 Elsevier Interactive Patient Education 2016 [employer]  Near-Syncope Near-syncope (commonly known as near fainting) is sudden weakness, dizziness, or feeling like you might pass out. During an episode of near-syncope, you may also develop pale skin, have tunnel vision, or feel sick to your stomach (nauseous). Near-syncope may occur when getting up after sitting or while standing for a long time. It is caused by a sudden decrease in blood flow to the brain. This decrease can result from various causes or triggers, most of which are not serious. However, because near-syncope can sometimes be a sign of something serious, a medical evaluation is required. The specific cause is often not determined. HOME CARE INSTRUCTIONS  Monitor your condition for any changes. The following actions may help to alleviate any discomfort you are experiencing:  Have someone stay with you until you feel stable.  Lie down right away and prop your feet  up if you start feeling like you might faint. Breathe deeply and steadily. Wait until all the symptoms have passed. Most of these episodes last only a few minutes. You may feel tired for  several hours.   Drink enough fluids to keep your urine clear or pale yellow.   If you are taking blood pressure or heart medicine, get up slowly when seated or lying down. Take several minutes to sit and then stand. This can reduce dizziness.  Follow up with your health care provider as directed. SEEK IMMEDIATE MEDICAL CARE IF:   You have a severe headache.   You have unusual pain in the chest, abdomen, or back.   You are bleeding from the mouth or rectum, or you have black or tarry stool.   You have an irregular or very fast heartbeat.   You have repeated fainting or have seizure-like jerking during an episode.   You faint when sitting or lying down.   You have confusion.   You have difficulty walking.   You have severe weakness.   You have vision problems.  MAKE SURE YOU:   Understand these instructions.  Will watch your condition.  Will get help right away if you are not doing well or get worse.   This information is not intended to replace advice given to you by your health care provider. Make sure you discuss any questions you have with your health care provider.   Document Released: 04/08/2005 Document Revised: 04/13/2013 Document Reviewed: 09/11/2012 Elsevier Interactive Patient Education 2016 [employer].  Hypokalemia Hypokalemia means that the amount of potassium in the blood is lower than normal.Potassium is a chemical, called an electrolyte, that helps regulate the amount of fluid in the body. It also stimulates muscle contraction and helps nerves function properly.Most of the body's potassium is inside of cells, and only a very small amount is in the blood. Because the amount in the blood is so small, minor changes can be life-threatening. CAUSES  Antibiotics.  Diarrhea or vomiting.  Using laxatives too much, which can cause diarrhea.  Chronic kidney disease.  Water pills (diuretics).  Eating disorders (bulimia).  Low magnesium  level.  Sweating a lot. SIGNS AND SYMPTOMS  Weakness.  Constipation.  Fatigue.  Muscle cramps.  Mental confusion.  Skipped heartbeats or irregular heartbeat (palpitations).  Tingling or numbness. DIAGNOSIS  Your health care provider can diagnose hypokalemia with blood tests. In addition to checking your potassium level, your health care provider may also check other lab tests. TREATMENT Hypokalemia can be treated with potassium supplements taken by mouth or adjustments in your current medicines. If your potassium level is very low, you may need to get potassium through a vein (IV) and be monitored in the hospital. A diet high in potassium is also helpful. Foods high in potassium are:  Nuts, such as peanuts and pistachios.  Seeds, such as sunflower seeds and pumpkin seeds.  Peas, lentils, and lima beans.  Whole grain and bran cereals and breads.  Fresh fruit and vegetables, such as apricots, avocado, bananas, cantaloupe, kiwi, oranges, tomatoes, asparagus, and potatoes.  Orange and tomato juices.  Red meats.  Fruit yogurt. HOME CARE INSTRUCTIONS  Take all medicines as prescribed by your health care provider.  Maintain a healthy diet by including nutritious food, such as fruits, vegetables, nuts, whole grains, and lean meats.  If you are taking a laxative, be sure to follow the directions on the label. SEEK MEDICAL CARE IF:  Your weakness gets worse.  You feel your heart pounding or racing.  You are vomiting or having diarrhea.  You are diabetic and having trouble keeping your blood glucose in the normal range. SEEK IMMEDIATE MEDICAL CARE IF:  You have chest pain, shortness of breath, or dizziness.  You are vomiting or having diarrhea for more than 2 days.  You faint. MAKE SURE YOU:   Understand these instructions.  Will watch your condition.  Will get help right away if you are not doing well or get worse.   This information is not intended to  replace advice given to you by your health care provider. Make sure you discuss any questions you have with your health care provider.   Document Released: 04/08/2005 Document Revised: 04/29/2014 Document Reviewed: 10/09/2012 Elsevier Interactive Patient Education [employer]

## 2015-02-08 NOTE — ED Provider Notes (Addendum)
CSN: [id number]     Arrival date & time 02/08/15  0447 History   First MD Initiated Contact with Patient 02/08/15 [id number]     Chief Complaint  Patient presents with  . Urticaria     (Consider location/radiation/quality/duration/timing/severity/associated sxs/prior Treatment) HPI Patient presents via EMS with diffuse urticaria. She states that around midnight she began itching diffusely. She states she felt nauseated and tried to get out of bed. She became lightheaded and thought that she may pass out. She went back to bed and woke around 0430 with hives on extremities, trunk, neck and face. She denies any intraoral swelling. She denies any shortness of breath. She was given 50 mg of Benadryl in route. She states the itching is improved the rash is still present. She has no new known exposures to medications, foods or cleaning supplies. She states she's had a similar episode in the past which resolved with Benadryl. Past Medical History  Diagnosis Date  . Hypertension   . Diabetes mellitus   . Cataract   . Anxiety   . Allergy   . Depression    Past Surgical History  Procedure Laterality Date  . Abdominal hysterectomy    . Cholecystectomy    . Cataract extraction Bilateral 05/03/2013  . Tubal ligation    . Knee arthroscopy Left    Family History  Problem Relation Age of Onset  . Colon cancer Neg Hx    Social History  Substance Use Topics  . Smoking status: Never Smoker   . Smokeless tobacco: Never Used  . Alcohol Use: No   OB History    No data available     Review of Systems  Constitutional: Negative for fever and chills.  HENT: Negative for facial swelling.   Respiratory: Negative for shortness of breath and stridor.   Cardiovascular: Negative for chest pain, palpitations and leg swelling.  Gastrointestinal: Positive for nausea. Negative for vomiting, abdominal pain and diarrhea.  Musculoskeletal: Negative for back pain, neck pain and neck stiffness.  Skin: Positive for  rash.  Neurological: Positive for dizziness and light-headedness. Negative for syncope, weakness, numbness and headaches.  All other systems reviewed and are negative.     Allergies  Codeine and Penicillins  Home Medications   Prior to Admission medications   Medication Sig Start Date End Date Taking? Authorizing Provider  ACCU-CHEK SOFTCLIX LANCETS lancets Test blood sugar 2 times a day. Dx code: 250.00 03/05/13  Yes Philemon Kingdom, MD  aspirin EC 81 MG tablet Take 81 mg by mouth daily.   Yes Historical Provider, MD  Aspirin-Salicylamide-Caffeine (BC HEADACHE POWDER PO) Take 1 packet by mouth 2 (two) times daily as needed. For headache   Yes Historical Provider, MD  Blood Glucose Monitoring Suppl (ACCU-CHEK NANO SMARTVIEW) W/DEVICE KIT Test blood sugar twice daily as instructed. 06/01/13  Yes Philemon Kingdom, MD  cyanocobalamin 1000 MCG tablet Take 1,000 mcg by mouth daily.    Yes Historical Provider, MD  diphenhydrAMINE (BENADRYL) 25 MG tablet Take 1 tablet (25 mg total) by mouth every 6 (six) hours. Patient taking differently: Take 25 mg by mouth every 6 (six) hours as needed for allergies.  07/01/12  Yes Linton Flemings, MD  EPINEPHrine (EPI-PEN) 0.3 mg/0.3 mL DEVI Inject 0.3 mLs (0.3 mg total) into the muscle once. Then proceed to the nearest ER Patient taking differently: Inject 0.3 mg into the muscle daily as needed (allergic reaction). Then proceed to the nearest ER 07/01/12  Yes Linton Flemings, MD  glucose blood (ACCU-CHEK  SMARTVIEW) test strip Use to test blood sugar 2 times daily as instructed. Dx code: 250.00 06/03/13  Yes Philemon Kingdom, MD  JANUVIA 100 MG tablet TAKE 1 TABLET (100 MG TOTAL) BY MOUTH DAILY. 04/19/13  Yes Philemon Kingdom, MD  lisinopril (PRINIVIL,ZESTRIL) 30 MG tablet Take 30 mg by mouth daily.   Yes Historical Provider, MD  metFORMIN (GLUCOPHAGE) 1000 MG tablet Take 1.5 (1500 mg) tablets daily. 06/21/13  Yes Philemon Kingdom, MD  PARoxetine (PAXIL) 20 MG tablet Take 20  mg by mouth every morning.   Yes Historical Provider, MD  traMADol (ULTRAM) 50 MG tablet Take 1 tablet (50 mg total) by mouth every 6 (six) hours as needed for pain. 02/19/13  Yes Fredia Sorrow, MD  triamterene-hydrochlorothiazide (MAXZIDE-25) 37.5-25 MG per tablet Take 1 tablet by mouth daily. 06/25/13  Yes Philemon Kingdom, MD  predniSONE (DELTASONE) 20 MG tablet 3 tabs po day one, then 2 tabs daily x 4 days 02/08/15   Julianne Rice, MD   BP 118/65 mmHg  Pulse 83  Temp(Src) 97.8 F (36.6 C) (Oral)  Resp 19  SpO2 97% Physical Exam  Constitutional: She is oriented to person, place, and time. She appears well-developed and well-nourished. No distress.  Speaks in a clear voice  HENT:  Head: Normocephalic and atraumatic.  Mouth/Throat: Oropharynx is clear and moist. No oropharyngeal exudate.  No intraoral swelling  Eyes: EOM are normal. Pupils are equal, round, and reactive to light.  Neck: Normal range of motion. Neck supple.  Cardiovascular: Normal rate and regular rhythm.  Exam reveals no gallop.   No murmur heard. Pulmonary/Chest: Effort normal and breath sounds normal. No stridor. No respiratory distress. She has no wheezes. She has no rales.  Abdominal: Soft. Bowel sounds are normal. She exhibits no distension and no mass. There is no tenderness. There is no rebound and no guarding.  Musculoskeletal: Normal range of motion. She exhibits no edema or tenderness.  No lower extremity swelling or pain.  Neurological: She is alert and oriented to person, place, and time.  Moves all extremities without deficit. Sensation is grossly intact.  Skin: Skin is warm and dry. Rash noted. No erythema.  Raised erythematous rash covering the patient's face,  extremities and trunk  Psychiatric: She has a normal mood and affect. Her behavior is normal.  Nursing note and vitals reviewed.   ED Course  Procedures (including critical care time) Labs Review Labs Reviewed  COMPREHENSIVE METABOLIC  PANEL - Abnormal; Notable for the following:    Potassium 2.9 (*)    Glucose, Bld 145 (*)    Total Protein 6.4 (*)    All other components within normal limits  CBC WITH DIFFERENTIAL/PLATELET - Abnormal; Notable for the following:    Hemoglobin 11.6 (*)    All other components within normal limits  TROPONIN I    Imaging Review No results found. I have personally reviewed and evaluated these images and lab results as part of my medical decision-making.   EKG Interpretation   Date/Time:  Wednesday February 08 2015 06:22:57 EDT Ventricular Rate:  76 PR Interval:  169 QRS Duration: 85 QT Interval:  375 QTC Calculation: 422 R Axis:   -13 Text Interpretation:  Sinus rhythm Low voltage, precordial leads  Borderline T abnormalities, anterior leads Confirmed by Lita Mains  MD,  Chaylee Ehrsam ([id number]) on 02/08/2015 6:40:52 AM      MDM   Final diagnoses:  Allergic reaction, initial encounter  Hypokalemia  Near syncope   Urticaria has significantly improved. Patient  is resting comfortable. Patient given oral potassium replacement.  Observed 2-1/2 and hours in the emergency department. No respiratory compromise. Rash is steadily improved. Return precautions given.   Julianne Rice, MD 02/08/15 [id number]  Julianne Rice, MD 02/08/15 [phone]

## 2015-02-08 NOTE — ED Notes (Signed)
Patient arrived via [facility]. EMS reports: patient went to bed approx 0000, and awakened at 0430 with hives covering her entire body. Itching. Anxious. EMS administered Benadryl 50 mg PO. BP 122/62, Pulse 74, Resp 20, 98% on room air.

## 2015-06-23 ENCOUNTER — Emergency Department (HOSPITAL_COMMUNITY)
Admission: EM | Admit: 2015-06-23 | Discharge: 2015-06-23 | Disposition: A | Discharge status: Home / Self Care | Payer: Commercial Managed Care - HMO | Attending: Emergency Medicine | Admitting: Emergency Medicine

## 2015-06-23 ENCOUNTER — Encounter (HOSPITAL_COMMUNITY): Payer: Self-pay | Admitting: *Deleted

## 2015-06-23 ENCOUNTER — Emergency Department (HOSPITAL_COMMUNITY): Payer: Commercial Managed Care - HMO

## 2015-06-23 DIAGNOSIS — I10 Essential (primary) hypertension: Secondary | ICD-10-CM | POA: Insufficient documentation

## 2015-06-23 DIAGNOSIS — Z79899 Other long term (current) drug therapy: Secondary | ICD-10-CM | POA: Insufficient documentation

## 2015-06-23 DIAGNOSIS — Z7984 Long term (current) use of oral hypoglycemic drugs: Secondary | ICD-10-CM | POA: Insufficient documentation

## 2015-06-23 DIAGNOSIS — F329 Major depressive disorder, single episode, unspecified: Secondary | ICD-10-CM | POA: Diagnosis not present

## 2015-06-23 DIAGNOSIS — E119 Type 2 diabetes mellitus without complications: Secondary | ICD-10-CM | POA: Insufficient documentation

## 2015-06-23 DIAGNOSIS — F419 Anxiety disorder, unspecified: Secondary | ICD-10-CM | POA: Insufficient documentation

## 2015-06-23 DIAGNOSIS — Z880 Allergy status to penicillin: Secondary | ICD-10-CM | POA: Diagnosis not present

## 2015-06-23 DIAGNOSIS — M1711 Unilateral primary osteoarthritis, right knee: Secondary | ICD-10-CM | POA: Insufficient documentation

## 2015-06-23 DIAGNOSIS — Z7982 Long term (current) use of aspirin: Secondary | ICD-10-CM | POA: Insufficient documentation

## 2015-06-23 DIAGNOSIS — M25561 Pain in right knee: Secondary | ICD-10-CM | POA: Diagnosis present

## 2015-06-23 LAB — CBG MONITORING, ED: Glucose-Capillary: 103 mg/dL — ABNORMAL HIGH (ref 65–99)

## 2015-06-23 MED ORDER — TRAMADOL HCL 50 MG PO TABS
50.0000 mg | ORAL_TABLET | Freq: Once | ORAL | Status: DC
  Filled 2015-06-23: qty 1

## 2015-06-23 MED ORDER — IBUPROFEN 400 MG PO TABS
600.0000 mg | ORAL_TABLET | Freq: Once | ORAL | Status: AC
  Administered 2015-06-23: 600 mg via ORAL
  Filled 2015-06-23: qty 1

## 2015-06-23 NOTE — ED Provider Notes (Signed)
CSN: [id number]     Arrival date & time 06/23/15  1338 History   First MD Initiated Contact with Patient 06/23/15 1952     Chief Complaint  Patient presents with  . Leg Pain     (Consider location/radiation/quality/duration/timing/severity/associated sxs/prior Treatment) HPI Comments: This is 70-year-old female with a history of hypertension, diabetes type 2.  Anxiety and depression who states 3 weeks ago while walking up the health.  She noticed that she had pain in her knees, has been persistent, worse in the right than the left.  She's tried topical rubs, immobilization, rest and an over-the-counter knee sleeve without any relief.  She has not contacted her physician for any further evaluation. Denies any numbness or tingling, swelling of the lower leg known injury.  Previous pain in the knees  Patient is a 70 y.o. female presenting with leg pain. The history is provided by the patient.  Leg Pain Location:  Knee Time since incident:  3 weeks Injury: yes   Mechanism of injury comment:  Walking Knee location:  R knee Pain details:    Quality:  Aching   Radiates to:  R leg   Severity:  Moderate   Onset quality:  Gradual   Duration:  3 weeks   Timing:  Constant   Progression:  Unchanged Chronicity:  New Dislocation: no   Foreign body present:  No foreign bodies Prior injury to area:  No Relieved by:  Nothing Worsened by:  Activity Ineffective treatments:  Immobilization (topical rub and knee sleeve) Associated symptoms: no decreased ROM, no fever, no numbness, no swelling and no tingling     Past Medical History  Diagnosis Date  . Hypertension   . Diabetes mellitus   . Cataract   . Anxiety   . Allergy   . Depression    Past Surgical History  Procedure Laterality Date  . Abdominal hysterectomy    . Cholecystectomy    . Cataract extraction Bilateral 05/03/2013  . Tubal ligation    . Knee arthroscopy Left    Family History  Problem Relation Age of Onset  . Colon  cancer Neg Hx    Social History  Substance Use Topics  . Smoking status: Never Smoker   . Smokeless tobacco: Never Used  . Alcohol Use: No   OB History    No data available     Review of Systems  Constitutional: Negative for fever and chills.  Respiratory: Negative for cough.   Cardiovascular: Negative for leg swelling.  Musculoskeletal: Negative for joint swelling.  Neurological: Negative for weakness and numbness.      Allergies  Codeine and Penicillins  Home Medications   Prior to Admission medications   Medication Sig Start Date End Date Taking? Authorizing Provider  aspirin EC 81 MG tablet Take 81 mg by mouth daily.   Yes Historical Provider, MD  Aspirin-Salicylamide-Caffeine (BC HEADACHE POWDER PO) Take 1 packet by mouth 2 (two) times daily as needed. For headache   Yes Historical Provider, MD  diphenhydrAMINE (BENADRYL) 25 MG tablet Take 1 tablet (25 mg total) by mouth every 6 (six) hours. Patient taking differently: Take 25 mg by mouth every 6 (six) hours as needed for allergies.  07/01/12  Yes Olga Otter, MD  EPINEPHrine (EPI-PEN) 0.3 mg/0.3 mL DEVI Inject 0.3 mLs (0.3 mg total) into the muscle once. Then proceed to the nearest ER Patient taking differently: Inject 0.3 mg into the muscle daily as needed (allergic reaction). Then proceed to the nearest ER 07/01/12    Yes Olga Otter, MD  JANUVIA 100 MG tablet TAKE 1 TABLET (100 MG TOTAL) BY MOUTH DAILY. 04/19/13  Yes Cristina Gherghe, MD  lisinopril (PRINIVIL,ZESTRIL) 30 MG tablet Take 30 mg by mouth daily.   Yes Historical Provider, MD  metFORMIN (GLUCOPHAGE) 1000 MG tablet Take 1.5 (1500 mg) tablets daily. 06/21/13  Yes Cristina Gherghe, MD  Multiple Vitamin (MULTIVITAMIN WITH MINERALS) TABS tablet Take 1 tablet by mouth daily.   Yes Historical Provider, MD  PARoxetine (PAXIL) 30 MG tablet Take 30 mg by mouth daily.   Yes Historical Provider, MD  triamterene-hydrochlorothiazide (MAXZIDE-25) 37.5-25 MG per tablet Take 1  tablet by mouth daily. 06/25/13  Yes Cristina Gherghe, MD  ACCU-CHEK SOFTCLIX LANCETS lancets Test blood sugar 2 times a day. Dx code: 250.00 03/05/13   Cristina Gherghe, MD  Blood Glucose Monitoring Suppl (ACCU-CHEK NANO SMARTVIEW) W/DEVICE KIT Test blood sugar twice daily as instructed. 06/01/13   Cristina Gherghe, MD  glucose blood (ACCU-CHEK SMARTVIEW) test strip Use to test blood sugar 2 times daily as instructed. Dx code: 250.00 06/03/13   Cristina Gherghe, MD  PARoxetine (PAXIL) 20 MG tablet Take 20 mg by mouth every morning. Reported on 06/23/2015    Historical Provider, MD  predniSONE (DELTASONE) 20 MG tablet 3 tabs po day one, then 2 tabs daily x 4 days Patient not taking: Reported on 06/23/2015 02/08/15   David Yelverton, MD  traMADol (ULTRAM) 50 MG tablet Take 1 tablet (50 mg total) by mouth every 6 (six) hours as needed for pain. Patient not taking: Reported on 06/23/2015 02/19/13   Scott Zackowski, MD   BP 135/67 mmHg  Pulse 74  Temp(Src) 98.1 F (36.7 C) (Oral)  Resp 18  Ht 5' 4" (1.626 m)  Wt 84.823 kg  BMI 32.08 kg/m2  SpO2 99% Physical Exam  Constitutional: She is oriented to person, place, and time. She appears well-developed and well-nourished.  HENT:  Head: Normocephalic.  Eyes: Pupils are equal, round, and reactive to light.  Neck: Normal range of motion.  Cardiovascular: Normal rate and regular rhythm.   Pulmonary/Chest: Effort normal and breath sounds normal.  Musculoskeletal: She exhibits tenderness. She exhibits no edema.       Right knee: She exhibits normal range of motion, no swelling, no effusion, no ecchymosis, no deformity, no erythema, normal alignment, no LCL laxity, no bony tenderness, normal meniscus and no MCL laxity. Tenderness found. Patellar tendon tenderness noted.  Neurological: She is alert and oriented to person, place, and time.  Skin: Skin is warm. No erythema.  Nursing note and vitals reviewed.   ED Course  Procedures (including critical care  time) Labs Review Labs Reviewed  CBG MONITORING, ED - Abnormal; Notable for the following:    Glucose-Capillary 103 (*)    All other components within normal limits    Imaging Review Dg Knee Complete 4 Views Right  06/23/2015  CLINICAL DATA:  RIGHT knee and calf pain for 3 weeks, no known trauma, initial encounter EXAM: RIGHT KNEE - COMPLETE 4+ VIEW COMPARISON:  None FINDINGS: Osseous demineralization. Joint spaces preserved. Diffuse chondrocalcinosis. No acute fracture, dislocation, or bone destruction. No knee joint effusion. IMPRESSION: Osseous demineralization with diffuse chondrocalcinosis question CPPD. No acute abnormalities. Electronically Signed   By: Mark  Boles M.D.   On: 06/23/2015 20:39   I have personally reviewed and evaluated these images and lab results as part of my medical decision-making.   EKG Interpretation None     patient has been informed of the radiology   reading.  It is recommended that she take extra Tylenol on a regular basis.  He can point with your primary care physician to discuss possible joint injection.  She understands these discharge instructions  MDM   Final diagnoses:  Osteoarthritis of right knee, unspecified osteoarthritis type         Junius Creamer, NP 06/23/15 [id number]  Orlie Dakin, MD 06/24/15 0347

## 2015-06-23 NOTE — ED Notes (Signed)
Pt c/o rt calf and knee pain for 3 weeks no temp

## 2015-06-23 NOTE — ED Notes (Signed)
Pt states she walked up a hill several weeks ago.  The next day she began experiencing pain to both legs bil.  Swelling to both knees, R greater than L.

## 2015-06-23 NOTE — Discharge Instructions (Signed)
Arthritis Arthritis means joint pain. It can also mean joint disease. A joint is a place where bones come together. People who have arthritis may have:  Red joints.  Swollen joints.  Stiff joints.  Warm joints.  A fever.  A feeling of being sick. HOME CARE Pay attention to any changes in your symptoms. Take these actions to help with your pain and swelling. Medicines  Take over-the-counter and prescription medicines only as told by your doctor.  Do not take aspirin for pain if your doctor says that you may have gout. Activities  Rest your joint if your doctor tells you to.  Avoid activities that make the pain worse.  Exercise your joint regularly as told by your doctor. Try doing exercises like:  Swimming.  Water aerobics.  Biking.  Walking. Joint Care  If your joint is swollen, keep it raised (elevated) if told by your doctor.  If your joint feels stiff in the morning, try taking a warm shower.  If you have diabetes, do not apply heat without asking your doctor.  If told, apply heat to the joint:  Put a towel between the joint and the hot pack or heating pad.  Leave the heat on the area for 20-30 minutes.  If told, apply ice to the joint:  Put ice in a plastic bag.  Place a towel between your skin and the bag.  Leave the ice on for 20 minutes, 2-3 times per day.  Keep all follow-up visits as told by your doctor. GET HELP IF:  The pain gets worse.  You have a fever. GET HELP RIGHT AWAY IF:  You have very bad pain in your joint.  You have swelling in your joint.  Your joint is red.  Many joints become painful and swollen.  You have very bad back pain.  Your leg is very weak.  You cannot control your pee (urine) or poop (stool).   This information is not intended to replace advice given to you by your health care provider. Make sure you discuss any questions you have with your health care provider.   Document Released: 07/03/2009  Document Revised: 12/28/2014 Document Reviewed: 07/04/2014 Elsevier Interactive Patient Education [employer]. Please use extra strength tylenol on a regular basis make an appointment with your PCP to obtain an referral to orthopedics for further treatment

## 2015-06-23 NOTE — ED Notes (Signed)
Patient transported to X-ray 

## 2015-06-23 NOTE — ED Provider Notes (Signed)
Patient is alert and ambulates without difficulty and without limp  Alison SouSam Jaxyn Mestas, MD 06/23/15 2147

## 2015-06-23 NOTE — ED Notes (Signed)
CHECKED CBG 103, RN ERIC INFORMED

## 2015-10-28 ENCOUNTER — Other Ambulatory Visit: Payer: Self-pay | Admitting: Internal Medicine

## 2015-10-28 DIAGNOSIS — E2839 Other primary ovarian failure: Secondary | ICD-10-CM

## 2016-10-08 ENCOUNTER — Other Ambulatory Visit: Payer: Self-pay | Admitting: Internal Medicine

## 2016-10-08 DIAGNOSIS — E2839 Other primary ovarian failure: Secondary | ICD-10-CM

## 2017-06-26 ENCOUNTER — Other Ambulatory Visit: Payer: Self-pay | Admitting: General Surgery

## 2017-06-26 DIAGNOSIS — K430 Incisional hernia with obstruction, without gangrene: Secondary | ICD-10-CM

## 2017-07-09 ENCOUNTER — Ambulatory Visit
Admission: RE | Admit: 2017-07-09 | Discharge: 2017-07-09 | Disposition: A | Discharge status: Home / Self Care | Payer: Medicare HMO | Source: Ambulatory Visit | Attending: General Surgery | Admitting: General Surgery

## 2017-07-09 DIAGNOSIS — K430 Incisional hernia with obstruction, without gangrene: Secondary | ICD-10-CM

## 2017-07-09 MED ORDER — IOPAMIDOL (ISOVUE-300) INJECTION 61%
100.0000 mL | Freq: Once | INTRAVENOUS | Status: AC | PRN
  Administered 2017-07-09: 100 mL via INTRAVENOUS

## 2017-08-12 ENCOUNTER — Ambulatory Visit: Payer: Self-pay | Admitting: General Surgery

## 2017-09-10 NOTE — Pre-Procedure Instructions (Signed)
Alison [name]  09/10/2017      [id number] - [place] (SE), [place] - [address] [place] (SE) [place] [address] Phone: [phone] Fax: [phone]  [id number] [place], [place] - [address] [place] [address] Phone: [phone] Fax: [phone]  [facility] - [place], [place] - [address] [place] [address] Phone: [phone] Fax: [phone]    Your procedure is scheduled on 09-18-2017  Thursday .  [facility] to [facility] Admitting at 5:30* A.M.  Call this number if you have problems the morning of surgery:  [phone]   Remember:  No food after midnight.  Nothing to drink after midnight                        Take these medicines the morning of surgery with A SIP OF WATER   Benadryl if needed Flonase nasal spray if needed Paroxetine(Paxil) Tizanidine(Zanaflex) if needed  Follow your doctors instructions regarding your Aspirin. If no instructions were given by your doctor ,then you will need to call the prescribing office to get instructions.  STOP TAKING ANY ASPIRIN(UNLESS OTHERWISE INSTRUCTED BY YOUR SURGEON),ANTIINFLAMATORIES (IBUPROFEN,ALEVE,MOTRIN,ADVIL,GOODY'S POWDERS),HERBAL SUPPLEMENTS,FISH OIL,AND VITAMINS 5-7 DAYS PRIOR TO SURGERY      How to Manage Your Diabetes Before and After Surgery  Why is it important to control my blood sugar before and after surgery? . Improving blood sugar levels before and after surgery helps healing and can limit problems. . A way of improving blood sugar control is eating a healthy diet by: o  Eating less sugar and carbohydrates o  Increasing activity/exercise o  Talking with your doctor about reaching your blood sugar goals . High blood sugars (greater than 180 mg/dL) can raise your risk of infections and slow your recovery, so you will need to focus on controlling your diabetes  during the weeks before surgery. . Make sure that the doctor who takes care of your diabetes knows about your planned surgery including the date and location.  How do I manage my blood sugar before surgery? . Check your blood sugar at least 4 times a day, starting 2 days before surgery, to make sure that the level is not too high or low. o Check your blood sugar the morning of your surgery when you wake up and every 2 hours until you get to the Short Stay unit. . If your blood sugar is less than 70 mg/dL, you will need to treat for low blood sugar: o Do not take insulin. o Treat a low blood sugar (less than 70 mg/dL) with  cup of clear juice (cranberry or apple), 4 glucose tablets, OR glucose gel. Recheck blood sugar in 15 minutes after treatment (to make sure it is greater than 70 mg/dL). If your blood sugar is not greater than 70 mg/dL on recheck, call [phone] o  for further instructions. . Report your blood sugar to the short stay nurse when you get to Short Stay.  . If you are admitted to the hospital after surgery: o Your blood sugar will be checked by the staff and you will probably be given insulin after surgery (instead of oral diabetes medicines) to make sure you have good blood sugar levels. o The goal for blood sugar control after surgery is 80-180 mg/dL.  WHAT DO I DO ABOUT MY DIABETES MEDICATION?   Marland Kitchen Do not take oral diabetes medicines (pills) the morning of surgery.Metformin  . THE NIGHT BEFORE SURGERY, take ___________ units of ___________insulin.       . THE MORNING OF SURGERY, take _____________ units of __________insulin.  . The day of surgery, do not take other diabetes injectables, including Byetta (exenatide), Bydureon (exenatide ER), Victoza (liraglutide), or Trulicity (dulaglutide).  . If your CBG is greater than 220 mg/dL, you may take  of your sliding scale (correction) dose of insulin.  Other Instructions:          Patient  Signature:  Date:   Nurse Signature:  Date:   Reviewed and Endorsed by [facility] Patient Education Committee, August 2015     Do not wear jewelry, make-up or nail polish.  Do not wear lotions, powders, or perfumes, or deodorant.   Do not shave 48 hours prior to surgery.  .  Do not bring valuables to the hospital.  [facility] is not responsible for any belongings or valuables.  Contacts, dentures or bridgework may not be worn into surgery.  Leave your suitcase in the car.  After surgery it may be brought to your room.  For patients admitted to the hospital, discharge time will be determined by your treatment team.  Patients discharged the day of surgery will not be allowed to drive home.   [place] - Preparing for Surgery  Before surgery, you can play an important role.  Because skin is not sterile, your skin needs to be as free of germs as possible.  You can reduce the number of germs on you skin by washing with CHG (chlorahexidine gluconate) soap before surgery.  CHG is an antiseptic cleaner which kills germs and bonds with the skin to continue killing germs even after washing.  Oral Hygiene is also important in reducing the risk of infection.  Remember to brush your teeth with your regular toothpaste the morning of surgery.  Please DO NOT use if you have an allergy to CHG or antibacterial soaps.  If your skin becomes reddened/irritated stop using the CHG and inform your nurse when you arrive at Short Stay.  Do not shave (including legs and underarms) for at least 48 hours prior to the first CHG shower.  You may shave your face.  Please follow these instructions carefully:   1.  Shower with CHG Soap the night before surgery and the morning of Surgery.  2.  If you choose to wash your hair, wash your hair first as usual with your normal shampoo.  3.  After you shampoo, rinse your hair and body thoroughly to remove the shampoo. 4.  Use CHG as you would any other liquid soap.  You  can apply chg directly to the skin and wash gently with a      scrungie or washcloth.           5.  Apply the CHG Soap to your body ONLY FROM THE NECK DOWN.   Do not use on open wounds or open sores. Avoid contact with your eyes, ears, mouth and genitals (private parts).  Wash genitals (private parts) with your normal soap.  6.  Wash thoroughly, paying special attention to the area where your surgery will be performed.  7.  Thoroughly rinse your body with warm water from the neck down.  8.  DO NOT shower/wash with your normal soap after using and rinsing off the CHG Soap.  9.  Pat yourself dry with a clean towel.            10.  Wear clean pajamas.            11.  Place clean sheets on your bed the night of your first shower and do not sleep with pets.  Day of Surgery  Do not apply any lotions/deoderants the morning of surgery.   Please wear clean clothes to the hospital/surgery center. Remember to brush your teeth with toothpaste.    Please read over the following fact sheets that you were given. Pain Booklet, Coughing and Deep Breathing and Surgical Site Infection Prevention

## 2017-09-11 ENCOUNTER — Ambulatory Visit (HOSPITAL_COMMUNITY)
Admission: RE | Admit: 2017-09-11 | Discharge: 2017-09-11 | Disposition: A | Discharge status: Home / Self Care | Payer: Medicare HMO | Source: Ambulatory Visit | Attending: General Surgery | Admitting: General Surgery

## 2017-09-11 ENCOUNTER — Encounter (HOSPITAL_COMMUNITY)
Admission: RE | Admit: 2017-09-11 | Discharge: 2017-09-11 | Disposition: A | Discharge status: Home / Self Care | Payer: Medicare HMO | Source: Ambulatory Visit | Attending: General Surgery | Admitting: General Surgery

## 2017-09-11 ENCOUNTER — Other Ambulatory Visit: Payer: Self-pay

## 2017-09-11 ENCOUNTER — Encounter (HOSPITAL_COMMUNITY): Payer: Self-pay

## 2017-09-11 ENCOUNTER — Ambulatory Visit (HOSPITAL_COMMUNITY): Admission: RE | Admit: 2017-09-11 | Payer: Medicare HMO | Source: Ambulatory Visit

## 2017-09-11 DIAGNOSIS — Z01818 Encounter for other preprocedural examination: Secondary | ICD-10-CM

## 2017-09-11 DIAGNOSIS — Z01810 Encounter for preprocedural cardiovascular examination: Secondary | ICD-10-CM | POA: Diagnosis not present

## 2017-09-11 DIAGNOSIS — Z01812 Encounter for preprocedural laboratory examination: Secondary | ICD-10-CM | POA: Insufficient documentation

## 2017-09-11 HISTORY — DX: Gastro-esophageal reflux disease without esophagitis: K21.9

## 2017-09-11 HISTORY — DX: Headache: R51

## 2017-09-11 HISTORY — DX: Unspecified macular degeneration: H35.30

## 2017-09-11 HISTORY — DX: Unspecified osteoarthritis, unspecified site: M19.90

## 2017-09-11 HISTORY — DX: Headache, unspecified: R51.9

## 2017-09-11 LAB — BASIC METABOLIC PANEL
Anion gap: 9 (ref 5–15)
BUN: 15 mg/dL (ref 6–20)
CHLORIDE: 108 mmol/L (ref 101–111)
CO2: 25 mmol/L (ref 22–32)
Calcium: 9.8 mg/dL (ref 8.9–10.3)
Creatinine, Ser: 0.94 mg/dL (ref 0.44–1.00)
GFR calc Af Amer: >60 mL/min (ref >60)
GFR calc non Af Amer: >60 mL/min (ref >60)
GLUCOSE: 80 mg/dL (ref 65–99)
POTASSIUM: 3.9 mmol/L (ref 3.5–5.1)
SODIUM: 142 mmol/L (ref 135–145)

## 2017-09-11 LAB — HEMOGLOBIN A1C
Hgb A1c MFr Bld: 6.2 % — ABNORMAL HIGH (ref 4.8–5.6)
MEAN PLASMA GLUCOSE: 131.24 mg/dL

## 2017-09-11 LAB — CBC WITH DIFFERENTIAL/PLATELET
Abs Immature Granulocytes: 0 10*3/uL (ref 0.0–0.1)
Basophils Absolute: 0.1 10*3/uL (ref 0.0–0.1)
Basophils Relative: 1 %
EOS PCT: 4 %
Eosinophils Absolute: 0.2 10*3/uL (ref 0.0–0.7)
HEMATOCRIT: 38.8 % (ref 36.0–46.0)
HEMOGLOBIN: 11.7 g/dL — AB (ref 12.0–15.0)
Immature Granulocytes: 0 %
LYMPHS ABS: 1.8 10*3/uL (ref 0.7–4.0)
LYMPHS PCT: 33 %
MCH: 25.8 pg — ABNORMAL LOW (ref 26.0–34.0)
MCHC: 30.2 g/dL (ref 30.0–36.0)
MCV: 85.7 fL (ref 78.0–100.0)
Monocytes Absolute: 0.3 10*3/uL (ref 0.1–1.0)
Monocytes Relative: 6 %
Neutro Abs: 3 10*3/uL (ref 1.7–7.7)
Neutrophils Relative %: 56 %
Platelets: 342 10*3/uL (ref 150–400)
RBC: 4.53 MIL/uL (ref 3.87–5.11)
RDW: 15.1 % (ref 11.5–15.5)
WBC: 5.5 10*3/uL (ref 4.0–10.5)

## 2017-09-11 LAB — GLUCOSE, CAPILLARY: Glucose-Capillary: 130 mg/dL — ABNORMAL HIGH (ref 65–99)

## 2017-09-11 NOTE — Progress Notes (Signed)
Denies any cardiac issues - no murmur, cp, sob.  No cardiac tests. PCP is Dr. Fleet Contras  LOV 08/2017  [phone] [facility])   He also now manages her diabetes medication. It used to be Dr. Elvera Lennox LOV 2015 I did ask why she changed and response was that since the macular degeneration, she is doesn't drive much and would like physicians to be closer. I have requested the last office note and ekg from Dr. Albertina Parr office She may check her sugar once a week or every other week.  Sugar ranges from 97-166

## 2017-09-11 NOTE — Pre-Procedure Instructions (Signed)
Alison [name]  09/11/2017      [facility] - [place] (SE), [place] - [address] [place] (SE) [place] [address] Phone: [phone] Fax: [phone]  [id number] [place], [place] - [address] [place] [address] Phone: [phone] Fax: [phone]  [facility] - [place], [place] - [address] [place] [address] Phone: [phone] Fax: [phone]    Your procedure is scheduled on 09-18-2017  Thursday .  Report to [facility] Admitting at 5:30* A.M.             (posted surgery time 7:30a - 9:30a)   Call this number if you have problems the morning of surgery:  [phone]   Remember:  No food after midnight.  Nothing to drink after midnight                        Take these medicines the morning of surgery with A SIP OF WATER   Benadryl if needed Flonase nasal spray if needed Paroxetine(Paxil) Tizanidine(Zanaflex) if needed  Follow your doctors instructions regarding your Aspirin. If no instructions were given by your doctor ,then you will need to call the prescribing office to get instructions.  STOP TAKING ANY ASPIRIN(UNLESS OTHERWISE INSTRUCTED BY YOUR SURGEON),ANTIINFLAMATORIES (IBUPROFEN,ALEVE,MOTRIN,ADVIL,GOODY'S POWDERS),HERBAL SUPPLEMENTS,FISH OIL,AND VITAMINS 5-7 DAYS PRIOR TO SURGERY    Do not wear jewelry, make-up or nail polish.  Do not wear lotions, powders,  perfumes, or deodorant.   Do not shave 48 hours prior to surgery.  .  Do not bring valuables to the hospital.  [facility] is not responsible for any belongings or valuables.  Contacts, dentures or bridgework may not be worn into surgery.  Leave your suitcase in the car.  After surgery it may be brought to your room.  For patients admitted to the hospital, discharge time will be determined by your treatment team.  Patients discharged the day of surgery will  not be allowed to drive home, and will need someone to stay with you for the first 24 hrs.     [place] - Preparing for Surgery  Before surgery, you can play an important role.  Because skin is not sterile, your skin needs to be as free of germs as possible.  You can reduce the number of germs on you skin by washing with CHG (chlorahexidine gluconate) soap before surgery.  CHG is an antiseptic cleaner which kills germs and bonds with the skin to continue killing germs even after washing.  Oral Hygiene is also important in reducing the risk of infection.  Remember to brush your teeth with your regular toothpaste the morning of surgery.  Please DO NOT use if you have an allergy to CHG or antibacterial soaps.  If your skin becomes reddened/irritated stop using the CHG and inform your nurse when you arrive at Short Stay.  Do not shave (including legs and underarms) for at least 48 hours prior to the first CHG shower.  You may shave your face.  Please follow these instructions carefully:   1.  Shower with CHG Soap the night before surgery and the morning of Surgery.  2.  If you choose to wash your hair, wash your hair first as usual with your normal shampoo.  3.  After you shampoo, rinse your hair and body thoroughly to remove the shampoo. 4.  Use CHG as  you would any other liquid soap.  You can apply chg directly to the skin and wash gently with a      scrungie or washcloth.           5.  Apply the CHG Soap to your body ONLY FROM THE NECK DOWN.   Do not use on open wounds or open sores. Avoid contact with your eyes, ears, mouth and genitals (private parts).  Wash genitals (private parts) with your normal soap.  6.  Wash thoroughly, paying special attention to the area where your surgery will be performed.  7.  Thoroughly rinse your body with warm water from the neck down.  8.  DO NOT shower/wash with your normal soap after using and rinsing off the CHG Soap.  9.  Pat yourself dry with a clean  towel.            10.  Wear clean pajamas.            11.  Place clean sheets on your bed the night of your first shower and do not sleep with pets.  Day of Surgery  Do not apply any lotions/deoderants the morning of surgery.   Please wear clean clothes to the hospital/surgery center. Remember to brush your teeth with toothpaste.  Please read over the following fact sheets that you were given. Pain Booklet and Surgical Site Infection Prevention           How to Manage Your Diabetes Before and After Surgery  Why is it important to control my blood sugar before and after surgery? . Improving blood sugar levels before and after surgery helps healing and can limit problems. . A way of improving blood sugar control is eating a healthy diet by: o  Eating less sugar and carbohydrates o  Increasing activity/exercise o  Talking with your doctor about reaching your blood sugar goals . High blood sugars (greater than 180 mg/dL) can raise your risk of infections and slow your recovery, so you will need to focus on controlling your diabetes during the weeks before surgery. . Make sure that the doctor who takes care of your diabetes knows about your planned surgery including the date and location.  How do I manage my blood sugar before surgery? . Check your blood sugar at least 4 times a day, starting 2 days before surgery, to make sure that the level is not too high or low. o Check your blood sugar the morning of your surgery when you wake up and every 2 hours until you get to the Short Stay unit. o  . If your blood sugar is less than 70 mg/dL, you will need to treat for low blood sugar: o Do not take insulin. o Treat a low blood sugar (less than 70 mg/dL) with  cup of clear juice (cranberry or apple), 4 glucose tablets, OR glucose gel. o  Recheck blood sugar in 15 minutes after treatment (to make sure it is greater than 70 mg/dL). If your blood sugar is not greater than 70 mg/dL on  recheck, call [phone] o  for further instructions. o  . Report your blood sugar to the short stay nurse when you get to Short Stay.  . If you are admitted to the hospital after surgery: o Your blood sugar will be checked by the staff and you will probably be given insulin after surgery (instead of oral diabetes medicines) to make sure you have good blood sugar levels. o  o The  goal for blood sugar control after surgery is 80-180 mg/dL.   WHAT DO I DO ABOUT MY DIABETES MEDICATION?   Marland Kitchen Do not take oral diabetes medicines (pills) the morning of surgery.  . The day of surgery, do not take other diabetes injectables, including Byetta (exenatide), Bydureon (exenatide ER), Victoza (liraglutide), or Trulicity (dulaglutide).  . If your CBG is greater than 220 mg/dL, you may take  of your sliding scale (correction) dose of insulin.  Other Instructions:       Patient Signature:  Date:   Nurse Signature:  Date:

## 2017-09-12 NOTE — Progress Notes (Signed)
Anesthesia Chart Review:  Case:  [id number] Date/Time:  09/18/17 0715   Procedures:      LAPAROSCOPIC POSSIBLE OPEN VENTRAL HERNIA REPAIR WITH MESH (N/A )     INSERTION OF MESH (N/A )   Anesthesia type:  General   Pre-op diagnosis:  VENTRAL INCISIONAL HERNIA   Location:  [facility] OR ROOM 01 / [place] [facility] OR   Surgeon:  Judeth Horn, MD      DISCUSSION: Patient is a 72 year old female scheduled for the above procedure. History includes never smoker, HTN, DM2. Labs, EKG, CXR results appear acceptable for OR. Anesthesiologist to evaluate on the day of surgery and discuss definitive anesthesia plan.  VS: BP (!) 161/72   Pulse 91   Temp 36.8 C   Resp 20   Ht 5' 3.5" (1.613 m)   Wt 178 lb 12.8 oz (81.1 kg)   SpO2 97%   BMI 31.18 kg/m   PROVIDERS: Nolene Ebbs, MD is PCP. Last visit 08/29/17. He referred patient to general surgery for evaluation of her ventral hernia.    LABS: Labs reviewed: Acceptable for surgery.  (all labs ordered are listed, but only abnormal results are displayed)  Labs Reviewed  GLUCOSE, CAPILLARY - Abnormal; Notable for the following components:      Result Value   Glucose-Capillary 130 (*)    All other components within normal limits  CBC WITH DIFFERENTIAL/PLATELET - Abnormal; Notable for the following components:   Hemoglobin 11.7 (*)    MCH 25.8 (*)    All other components within normal limits  HEMOGLOBIN A1C - Abnormal; Notable for the following components:   Hgb A1c MFr Bld 6.2 (*)    All other components within normal limits  BASIC METABOLIC PANEL    IMAGES: CXR 09/11/17: IMPRESSION: No active cardiopulmonary disease.   EKG: 08/29/17 ([facility]): SR, LAD.    CV: She has a remote history of a stress test in 2000.  Past Medical History:  Diagnosis Date  . Allergy   . Anxiety   . Arthritis    and muscle spasms in both shoulders  . Cataract   . Depression   . Diabetes mellitus   . GERD (gastroesophageal reflux disease)    takes otc  .  Headache    migraines when she was younger  . Hypertension   . Macular degeneration of both eyes     Past Surgical History:  Procedure Laterality Date  . ABDOMINAL HYSTERECTOMY    . CATARACT EXTRACTION Bilateral 05/03/2013  . CHOLECYSTECTOMY    . EYE SURGERY    . KNEE ARTHROSCOPY Left   . TUBAL LIGATION      MEDICATIONS: . ACCU-CHEK SOFTCLIX LANCETS lancets  . aspirin EC 81 MG tablet  . Aspirin-Salicylamide-Caffeine (BC HEADACHE POWDER PO)  . Biotin 1000 MCG tablet  . Blood Glucose Monitoring Suppl (ACCU-CHEK NANO SMARTVIEW) W/DEVICE KIT  . Cholecalciferol (VITAMIN D3) 5000 units CAPS  . diclofenac (VOLTAREN) 75 MG EC tablet  . diphenhydrAMINE (BENADRYL) 25 MG tablet  . fluticasone (FLONASE) 50 MCG/ACT nasal spray  . glucose blood (ACCU-CHEK SMARTVIEW) test strip  . JANUVIA 100 MG tablet  . lisinopril (PRINIVIL,ZESTRIL) 20 MG tablet  . metFORMIN (GLUCOPHAGE) 500 MG tablet  . Multiple Vitamin (MULTIVITAMIN WITH MINERALS) TABS tablet  . Multiple Vitamins-Minerals (OCUVITE PO)  . PARoxetine (PAXIL) 30 MG tablet  . simvastatin (ZOCOR) 10 MG tablet  . tiZANidine (ZANAFLEX) 4 MG tablet   No current facility-administered medications for this encounter.  Based on PAT records, she was instructed at PAT to hold ASA 5-7 days prior to surgery unless otherwise instructed by surgeon.   George Hugh [facility] Short Stay Center/Anesthesiology Phone [phone] 09/12/2017 11:12 AM

## 2017-09-18 ENCOUNTER — Encounter (HOSPITAL_COMMUNITY)
Admission: RE | Disposition: A | Discharge status: Home / Self Care | Payer: Self-pay | Source: Ambulatory Visit | Attending: General Surgery

## 2017-09-18 ENCOUNTER — Ambulatory Visit (HOSPITAL_COMMUNITY)
Admission: RE | Admit: 2017-09-18 | Discharge: 2017-09-18 | Disposition: A | Discharge status: Home / Self Care | Payer: Medicare HMO | Source: Ambulatory Visit | Attending: General Surgery | Admitting: General Surgery

## 2017-09-18 ENCOUNTER — Ambulatory Visit (HOSPITAL_COMMUNITY): Payer: Medicare HMO | Admitting: Certified Registered Nurse Anesthetist

## 2017-09-18 ENCOUNTER — Encounter (HOSPITAL_COMMUNITY): Payer: Self-pay | Admitting: *Deleted

## 2017-09-18 ENCOUNTER — Ambulatory Visit (HOSPITAL_COMMUNITY): Payer: Medicare HMO | Admitting: Vascular Surgery

## 2017-09-18 DIAGNOSIS — F419 Anxiety disorder, unspecified: Secondary | ICD-10-CM | POA: Insufficient documentation

## 2017-09-18 DIAGNOSIS — Z79899 Other long term (current) drug therapy: Secondary | ICD-10-CM | POA: Insufficient documentation

## 2017-09-18 DIAGNOSIS — K432 Incisional hernia without obstruction or gangrene: Secondary | ICD-10-CM | POA: Diagnosis present

## 2017-09-18 DIAGNOSIS — K430 Incisional hernia with obstruction, without gangrene: Secondary | ICD-10-CM | POA: Diagnosis not present

## 2017-09-18 DIAGNOSIS — E119 Type 2 diabetes mellitus without complications: Secondary | ICD-10-CM | POA: Diagnosis not present

## 2017-09-18 DIAGNOSIS — Z7984 Long term (current) use of oral hypoglycemic drugs: Secondary | ICD-10-CM | POA: Insufficient documentation

## 2017-09-18 DIAGNOSIS — Z7982 Long term (current) use of aspirin: Secondary | ICD-10-CM | POA: Diagnosis not present

## 2017-09-18 DIAGNOSIS — M1990 Unspecified osteoarthritis, unspecified site: Secondary | ICD-10-CM | POA: Insufficient documentation

## 2017-09-18 DIAGNOSIS — Z880 Allergy status to penicillin: Secondary | ICD-10-CM | POA: Insufficient documentation

## 2017-09-18 DIAGNOSIS — K219 Gastro-esophageal reflux disease without esophagitis: Secondary | ICD-10-CM | POA: Insufficient documentation

## 2017-09-18 DIAGNOSIS — I10 Essential (primary) hypertension: Secondary | ICD-10-CM | POA: Diagnosis not present

## 2017-09-18 DIAGNOSIS — F329 Major depressive disorder, single episode, unspecified: Secondary | ICD-10-CM | POA: Insufficient documentation

## 2017-09-18 DIAGNOSIS — E7800 Pure hypercholesterolemia, unspecified: Secondary | ICD-10-CM | POA: Diagnosis not present

## 2017-09-18 DIAGNOSIS — Z91040 Latex allergy status: Secondary | ICD-10-CM | POA: Insufficient documentation

## 2017-09-18 DIAGNOSIS — Z885 Allergy status to narcotic agent status: Secondary | ICD-10-CM | POA: Diagnosis not present

## 2017-09-18 HISTORY — PX: VENTRAL HERNIA REPAIR: SHX424

## 2017-09-18 HISTORY — PX: INSERTION OF MESH: SHX5868

## 2017-09-18 LAB — GLUCOSE, CAPILLARY
Glucose-Capillary: 104 mg/dL — ABNORMAL HIGH (ref 65–99)
Glucose-Capillary: 141 mg/dL — ABNORMAL HIGH (ref 65–99)

## 2017-09-18 SURGERY — REPAIR, HERNIA, VENTRAL, LAPAROSCOPIC
Anesthesia: General | Site: Abdomen

## 2017-09-18 MED ORDER — OXYCODONE HCL 5 MG PO TABS
ORAL_TABLET | ORAL | Status: AC
  Filled 2017-09-18: qty 1

## 2017-09-18 MED ORDER — CELECOXIB 200 MG PO CAPS
ORAL_CAPSULE | ORAL | Status: AC
  Administered 2017-09-18: 200 mg
  Filled 2017-09-18: qty 1

## 2017-09-18 MED ORDER — DEXAMETHASONE SODIUM PHOSPHATE 10 MG/ML IJ SOLN
INTRAMUSCULAR | Status: DC | PRN
  Administered 2017-09-18: 4 mg via INTRAVENOUS

## 2017-09-18 MED ORDER — OXYCODONE HCL 5 MG PO TABS: 5.0000 mg | ORAL_TABLET | Freq: Four times a day (QID) | ORAL | 0 refills | Status: DC | PRN

## 2017-09-18 MED ORDER — 0.9 % SODIUM CHLORIDE (POUR BTL) OPTIME
TOPICAL | Status: DC | PRN
  Administered 2017-09-18: 1000 mL

## 2017-09-18 MED ORDER — CHLORHEXIDINE GLUCONATE CLOTH 2 % EX PADS: 6.0000 | MEDICATED_PAD | Freq: Once | CUTANEOUS | Status: DC

## 2017-09-18 MED ORDER — BUPIVACAINE-EPINEPHRINE (PF) 0.25% -1:200000 IJ SOLN
INTRAMUSCULAR | Status: AC
  Filled 2017-09-18: qty 30

## 2017-09-18 MED ORDER — DEXTROSE 5 % IV SOLN
INTRAVENOUS | Status: DC | PRN
  Administered 2017-09-18: 25 ug/min via INTRAVENOUS

## 2017-09-18 MED ORDER — DEXAMETHASONE SODIUM PHOSPHATE 10 MG/ML IJ SOLN
INTRAMUSCULAR | Status: AC
  Filled 2017-09-18: qty 1

## 2017-09-18 MED ORDER — OXYCODONE HCL 5 MG/5ML PO SOLN: 5.0000 mg | Freq: Once | ORAL | Status: DC | PRN

## 2017-09-18 MED ORDER — LACTATED RINGERS IV SOLN
INTRAVENOUS | Status: DC | PRN
  Administered 2017-09-18 (×2): via INTRAVENOUS

## 2017-09-18 MED ORDER — ROCURONIUM BROMIDE 10 MG/ML (PF) SYRINGE
PREFILLED_SYRINGE | INTRAVENOUS | Status: AC
  Filled 2017-09-18: qty 5

## 2017-09-18 MED ORDER — PROMETHAZINE HCL 25 MG/ML IJ SOLN
INTRAMUSCULAR | Status: AC
  Filled 2017-09-18: qty 1

## 2017-09-18 MED ORDER — PHENYLEPHRINE 40 MCG/ML (10ML) SYRINGE FOR IV PUSH (FOR BLOOD PRESSURE SUPPORT)
PREFILLED_SYRINGE | INTRAVENOUS | Status: DC | PRN
  Administered 2017-09-18 (×2): 120 ug via INTRAVENOUS

## 2017-09-18 MED ORDER — SODIUM CHLORIDE 0.9 % IJ SOLN
INTRAMUSCULAR | Status: AC
  Filled 2017-09-18: qty 10

## 2017-09-18 MED ORDER — PROMETHAZINE HCL 25 MG/ML IJ SOLN
6.2500 mg | INTRAMUSCULAR | Status: DC | PRN
  Administered 2017-09-18: 6.25 mg via INTRAVENOUS

## 2017-09-18 MED ORDER — GABAPENTIN 300 MG PO CAPS
ORAL_CAPSULE | ORAL | Status: AC
  Administered 2017-09-18: 300 mg
  Filled 2017-09-18: qty 1

## 2017-09-18 MED ORDER — CIPROFLOXACIN IN D5W 400 MG/200ML IV SOLN
400.0000 mg | INTRAVENOUS | Status: AC
  Administered 2017-09-18: 400 mg via INTRAVENOUS

## 2017-09-18 MED ORDER — GABAPENTIN 300 MG PO CAPS: 300.0000 mg | ORAL_CAPSULE | ORAL | Status: DC

## 2017-09-18 MED ORDER — BUPIVACAINE-EPINEPHRINE 0.25% -1:200000 IJ SOLN
INTRAMUSCULAR | Status: DC | PRN
  Administered 2017-09-18: 10 mL

## 2017-09-18 MED ORDER — HYDROMORPHONE HCL 2 MG/ML IJ SOLN
INTRAMUSCULAR | Status: AC
  Filled 2017-09-18: qty 1

## 2017-09-18 MED ORDER — SODIUM CHLORIDE 0.9 % IV SOLN
INTRAVENOUS | Status: AC
  Filled 2017-09-18: qty 500000

## 2017-09-18 MED ORDER — CELECOXIB 200 MG PO CAPS: 200.0000 mg | ORAL_CAPSULE | ORAL | Status: DC

## 2017-09-18 MED ORDER — MEPERIDINE HCL 50 MG/ML IJ SOLN: 6.2500 mg | INTRAMUSCULAR | Status: DC | PRN

## 2017-09-18 MED ORDER — CIPROFLOXACIN IN D5W 400 MG/200ML IV SOLN
INTRAVENOUS | Status: AC
  Filled 2017-09-18: qty 200

## 2017-09-18 MED ORDER — PROPOFOL 10 MG/ML IV BOLUS
INTRAVENOUS | Status: AC
  Filled 2017-09-18: qty 40

## 2017-09-18 MED ORDER — ONDANSETRON HCL 4 MG/2ML IJ SOLN
INTRAMUSCULAR | Status: DC | PRN
  Administered 2017-09-18: 4 mg via INTRAVENOUS

## 2017-09-18 MED ORDER — LIDOCAINE 2% (20 MG/ML) 5 ML SYRINGE
INTRAMUSCULAR | Status: AC
  Filled 2017-09-18: qty 5

## 2017-09-18 MED ORDER — SODIUM CHLORIDE 0.9 % IR SOLN
Status: DC | PRN
  Administered 2017-09-18: 1000 mL

## 2017-09-18 MED ORDER — MIDAZOLAM HCL 2 MG/2ML IJ SOLN
INTRAMUSCULAR | Status: AC
  Filled 2017-09-18: qty 2

## 2017-09-18 MED ORDER — HYDROMORPHONE HCL 2 MG/ML IJ SOLN
0.2500 mg | INTRAMUSCULAR | Status: DC | PRN
  Administered 2017-09-18: 0.5 mg via INTRAVENOUS

## 2017-09-18 MED ORDER — ACETAMINOPHEN 500 MG PO TABS: 1000.0000 mg | ORAL_TABLET | ORAL | Status: DC

## 2017-09-18 MED ORDER — FENTANYL CITRATE (PF) 100 MCG/2ML IJ SOLN
INTRAMUSCULAR | Status: DC | PRN
  Administered 2017-09-18 (×2): 50 ug via INTRAVENOUS

## 2017-09-18 MED ORDER — PROPOFOL 10 MG/ML IV BOLUS
INTRAVENOUS | Status: DC | PRN
  Administered 2017-09-18: 160 mg via INTRAVENOUS

## 2017-09-18 MED ORDER — ONDANSETRON HCL 4 MG/2ML IJ SOLN
INTRAMUSCULAR | Status: AC
  Filled 2017-09-18: qty 2

## 2017-09-18 MED ORDER — ROCURONIUM BROMIDE 10 MG/ML (PF) SYRINGE
PREFILLED_SYRINGE | INTRAVENOUS | Status: DC | PRN
  Administered 2017-09-18: 50 mg via INTRAVENOUS

## 2017-09-18 MED ORDER — FENTANYL CITRATE (PF) 250 MCG/5ML IJ SOLN
INTRAMUSCULAR | Status: AC
  Filled 2017-09-18: qty 5

## 2017-09-18 MED ORDER — MIDAZOLAM HCL 5 MG/5ML IJ SOLN
INTRAMUSCULAR | Status: DC | PRN
  Administered 2017-09-18: 1 mg via INTRAVENOUS

## 2017-09-18 MED ORDER — SODIUM CHLORIDE 0.9 % IV SOLN
INTRAVENOUS | Status: DC | PRN
  Administered 2017-09-18: 500 mL

## 2017-09-18 MED ORDER — ACETAMINOPHEN 500 MG PO TABS
ORAL_TABLET | ORAL | Status: AC
  Administered 2017-09-18: 1000 mg
  Filled 2017-09-18: qty 2

## 2017-09-18 MED ORDER — SUGAMMADEX SODIUM 200 MG/2ML IV SOLN
INTRAVENOUS | Status: DC | PRN
  Administered 2017-09-18: 200 mg via INTRAVENOUS

## 2017-09-18 MED ORDER — LIDOCAINE 2% (20 MG/ML) 5 ML SYRINGE
INTRAMUSCULAR | Status: DC | PRN
  Administered 2017-09-18: 60 mg via INTRAVENOUS

## 2017-09-18 MED ORDER — LIDOCAINE IN D5W 4-5 MG/ML-% IV SOLN
1.0000 mg/min | INTRAVENOUS | Status: DC
  Administered 2017-09-18: 25 ug/kg/min via INTRAVENOUS
  Filled 2017-09-18: qty 500

## 2017-09-18 MED ORDER — OXYCODONE HCL 5 MG PO TABS: 5.0000 mg | ORAL_TABLET | Freq: Once | ORAL | Status: DC | PRN

## 2017-09-18 SURGICAL SUPPLY — 50 items
ADH SKN CLS APL DERMABOND .7 (GAUZE/BANDAGES/DRESSINGS) ×2
APPLIER CLIP 5 13 M/L LIGAMAX5 (MISCELLANEOUS)
APPLIER CLIP ROT 10 11.4 M/L (STAPLE)
APR CLP MED LRG 11.4X10 (STAPLE)
APR CLP MED LRG 5 ANG JAW (MISCELLANEOUS)
BAG DECANTER FOR FLEXI CONT (MISCELLANEOUS) ×4 IMPLANT
BINDER ABDOMINAL 12 ML 46-62 (SOFTGOODS) ×4 IMPLANT
CANISTER SUCT 3000ML PPV (MISCELLANEOUS) ×2 IMPLANT
CHLORAPREP W/TINT 26ML (MISCELLANEOUS) ×4 IMPLANT
CLIP APPLIE 5 13 M/L LIGAMAX5 (MISCELLANEOUS) IMPLANT
CLIP APPLIE ROT 10 11.4 M/L (STAPLE) IMPLANT
CLOSURE STERI-STRIP 1/2X4 (GAUZE/BANDAGES/DRESSINGS) ×1
CLSR STERI-STRIP ANTIMIC 1/2X4 (GAUZE/BANDAGES/DRESSINGS) ×3 IMPLANT
DECANTER SPIKE VIAL GLASS SM (MISCELLANEOUS) ×4 IMPLANT
DERMABOND ADVANCED (GAUZE/BANDAGES/DRESSINGS) ×2
DERMABOND ADVANCED .7 DNX12 (GAUZE/BANDAGES/DRESSINGS) ×2 IMPLANT
DEVICE SECURE STRAP 25 ABSORB (INSTRUMENTS) ×6 IMPLANT
DEVICE TROCAR PUNCTURE CLOSURE (ENDOMECHANICALS) ×4 IMPLANT
DRSG TEGADERM 2-3/8X2-3/4 SM (GAUZE/BANDAGES/DRESSINGS) ×16 IMPLANT
DRSG TEGADERM 4X4.75 (GAUZE/BANDAGES/DRESSINGS) ×2 IMPLANT
ELECT REM PT RETURN 9FT ADLT (ELECTROSURGICAL) ×4
ELECTRODE REM PT RTRN 9FT ADLT (ELECTROSURGICAL) ×2 IMPLANT
GLOVE BIOGEL PI IND STRL 8 (GLOVE) ×2 IMPLANT
GLOVE BIOGEL PI INDICATOR 8 (GLOVE) ×2
GLOVE ECLIPSE 7.5 STRL STRAW (GLOVE) ×4 IMPLANT
GLOVE SURG SS PI 8.0 STRL IVOR (GLOVE) ×6 IMPLANT
GOWN STRL REUS W/ TWL LRG LVL3 (GOWN DISPOSABLE) ×6 IMPLANT
GOWN STRL REUS W/TWL LRG LVL3 (GOWN DISPOSABLE) ×12
KIT BASIN OR (CUSTOM PROCEDURE TRAY) ×4 IMPLANT
KIT TURNOVER KIT B (KITS) ×4 IMPLANT
MARKER SKIN DUAL TIP RULER LAB (MISCELLANEOUS) ×4 IMPLANT
MESH VENTRALIGHT ST 7X9N (Mesh General) ×2 IMPLANT
NDL INSUFFLATION 14GA 120MM (NEEDLE) IMPLANT
NDL SPNL 22GX3.5 QUINCKE BK (NEEDLE) ×2 IMPLANT
NEEDLE INSUFFLATION 14GA 120MM (NEEDLE) ×4 IMPLANT
NEEDLE SPNL 22GX3.5 QUINCKE BK (NEEDLE) ×4 IMPLANT
NS IRRIG 1000ML POUR BTL (IV SOLUTION) ×4 IMPLANT
PAD ARMBOARD 7.5X6 YLW CONV (MISCELLANEOUS) ×8 IMPLANT
SCISSORS LAP 5X35 DISP (ENDOMECHANICALS) ×4 IMPLANT
SLEEVE ENDOPATH XCEL 5M (ENDOMECHANICALS) ×6 IMPLANT
SUT MNCRL AB 4-0 PS2 18 (SUTURE) ×4 IMPLANT
SUT NOVA 0 T19/GS 22DT (SUTURE) ×4 IMPLANT
TOWEL OR 17X24 6PK STRL BLUE (TOWEL DISPOSABLE) ×4 IMPLANT
TRAY FOLEY W/BAG SLVR 14FR (SET/KITS/TRAYS/PACK) ×4 IMPLANT
TRAY LAPAROSCOPIC MC (CUSTOM PROCEDURE TRAY) ×4 IMPLANT
TROCAR XCEL BLUNT TIP 100MML (ENDOMECHANICALS) IMPLANT
TROCAR XCEL NON-BLD 11X100MML (ENDOMECHANICALS) ×4 IMPLANT
TROCAR XCEL NON-BLD 5MMX100MML (ENDOMECHANICALS) ×4 IMPLANT
TUBING INSUFFLATION (TUBING) ×4 IMPLANT
WATER STERILE IRR 1000ML POUR (IV SOLUTION) ×4 IMPLANT

## 2017-09-18 NOTE — Progress Notes (Signed)
DC instructions reviewed with patient and son at bedside, called and spoke with Dr. Lindie Spruce [facility] [address], called [facility] and canceled prescription that was sent there, patient to follow up with Dr. Lindie Spruce in the office. DC IV.  Hermina Barters, RN

## 2017-09-18 NOTE — Discharge Instructions (Addendum)
Laparoscopic Ventral Hernia Repair, Care After This sheet gives you information about how to care for yourself after your procedure. Your health care provider may also give you more specific instructions. If you have problems or questions, contact your health care provider. What can I expect after the procedure? After the procedure, it is common to have:  Pain, discomfort, or soreness.  Follow these instructions at home: Incision care  Follow instructions from your health care provider about how to take care of your incision. Make sure you: ? Wash your hands with soap and water before you change your bandage (dressing) or before you touch your abdomen. If soap and water are not available, use hand sanitizer. ? Change your dressing as told by your health care provider. ? Leave stitches (sutures), skin glue, or adhesive strips in place. These skin closures may need to stay in place for 2 weeks or longer. If adhesive strip edges start to loosen and curl up, you may trim the loose edges. Do not remove adhesive strips completely unless your health care provider tells you to do that.  Check your incision area every day for signs of infection. Check for: ? Redness, swelling, or pain. ? Fluid or blood. ? Warmth. ? Pus or a bad smell. Bathing  Do not take baths, swim, or use a hot tub until your health care provider approves. Ask your health care provider if you can take showers. You may only be allowed to take sponge baths for bathing.  Keep your bandage (dressing) dry until your health care provider says it can be removed. Activity  Do not lift anything that is heavier than 10 lb (4.5 kg) until your health care provider approves.  Do not drive or use heavy machinery while taking prescription pain medicine. Ask your health care provider when it is safe for you to drive or use heavy machinery.  Do not drive for 24 hours if you were given a medicine to help you relax (sedative) during your  procedure.  Rest as told by your health care provider. You may return to your normal activities when your health care provider approves.  Wear abdominal binder at all times General instructions  Take over-the-counter and prescription medicines only as told by your health care provider.  To prevent or treat constipation while you are taking prescription pain medicine, your health care provider may recommend that you: ? Take over-the-counter or prescription medicines. ? Eat foods that are high in fiber, such as fresh fruits and vegetables, whole grains, and beans. ? Limit foods that are high in fat and processed sugars, such as fried and sweet foods.  Drink enough fluid to keep your urine clear or pale yellow.  Hold a pillow over your abdomen when you cough or sneeze. This helps with pain.  Keep all follow-up visits as told by your health care provider. This is important. Contact a health care provider if:  You have: ? A fever or chills. ? Redness, swelling, or pain around your incision. ? Fluid or blood coming from your incision. ? Pus or a bad smell coming from your incision. ? Pain that gets worse or does not get better with medicine. ? Nausea or vomiting. ? A cough. ? Shortness of breath.  Your incision feels warm to the touch.  You have not had a bowel movement in three days.  You are not able to urinate. Get help right away if:  You have severe pain in your abdomen.  You have persistent  nausea and vomiting.  You have redness, warmth, or pain in your leg.  You have chest pain.  You have trouble breathing. Summary  After this procedure, it is common to have pain, discomfort, or soreness.  Follow instructions from your health care provider about how to take care of your incision.  Check your incision area every day for signs of infection. Report any signs of infection to your health care provider.  Keep all follow-up visits as told by your health care provider.  This is important. This information is not intended to replace advice given to you by your health care provider. Make sure you discuss any questions you have with your health care provider.  Marta Lamas. Gae Bon, MD, FACS [phone] [facility]

## 2017-09-18 NOTE — Anesthesia Postprocedure Evaluation (Signed)
Anesthesia Post Note  Patient: Alison [name]  Procedure(s) Performed: LAPAROSCOPIC VENTRAL HERNIA REPAIR WITH MESH (N/A Abdomen) INSERTION OF MESH (N/A )     Patient location during evaluation: PACU Anesthesia Type: General Level of consciousness: awake and alert Pain management: pain level controlled Vital Signs Assessment: post-procedure vital signs reviewed and stable Respiratory status: spontaneous breathing, nonlabored ventilation and respiratory function stable Cardiovascular status: blood pressure returned to baseline and stable Postop Assessment: no apparent nausea or vomiting Anesthetic complications: no    Last Vitals:  Vitals:   09/18/17 1015 09/18/17 1030  BP: 118/61 (!) 142/64  Pulse: 85 93  Resp: (!) 23 (!) 23  Temp:    SpO2: 99% 98%    Last Pain:  Vitals:   09/18/17 1030  TempSrc:   PainSc: Asleep                 Lowella Curb

## 2017-09-18 NOTE — Op Note (Signed)
OPERATIVE REPORT  DATE OF OPERATION: 09/18/2017  PATIENT:  Alison [name]  72 y.o. female  PRE-OPERATIVE DIAGNOSIS:  VENTRAL INCISIONAL HERNIA  POST-OPERATIVE DIAGNOSIS:  VENTRAL INCISIONAL HERNIA WITH INCARCERATED OMENTUM   INDICATION(S) FOR OPERATION:  Patient with a symptomatic ventral hernia  FINDINGS:  Omental incarceration and abdominal wall adhesions  PROCEDURE:  Procedure(s): LAPAROSCOPIC VENTRAL HERNIA REPAIR WITH MESH INSERTION OF MESH  SURGEON:  Surgeon(s): Jimmye Norman, MD  ASSISTANT: RNFA  ANESTHESIA:   general  COMPLICATIONS:  None  EBL: 20 ml  BLOOD ADMINISTERED: none  DRAINS: none   SPECIMEN:  No Specimen  COUNTS CORRECT:  YES  PROCEDURE DETAILS: The patient was taken to the operating room and placed on the table in the supine position.  After adequate general endotracheal anesthetic was administered, she was prepped and draped in usual sterile manner exposing the abdomen.  A proper timeout was performed identifying the patient and the procedure to be performed.  We started the procedure with a Veress needle passed into the peritoneal cavity using the saline drop test in the left upper quadrant.  Once we were safely in the peritoneal cavity with the Veress needle we insufflated carbon dioxide gas up to a maximal intra-abdominal pressure of 15 mmHg.  We subsequently converted that varies needle insertion site to an 11 mm cannula side using an ophthalmic, light source passing it into the peritoneal cavity safely.  After that was inserted we insufflated gas through the cannula and then inserted 3 of the 5 mm cannulas under direct vision's one in the left midabdomen one in the left lower quadrant the last one in the right lower quadrant.  The patient was placed in reverse Trendelenburg position as we looked at the ventral hernia in the intra-abdominal wall which measured probably about 5 cm in diameter.  There was incarcerated omentum which was easily dissected  free from the edges of the hernia sac and we dissected away adhesions of omentum to the abdominal wall using laparoscopic scissors taking care not to injure surrounding viscera.  Once we had a clear margin around the hernia we marked the edges of the sac using a marking pen and a spinal needle that measured 5 cm from the margin.  We then came out with a piece of mesh that was needed approximately 18 x 18 cm in size.  We chose a piece of coated Bard mesh measuring 17.8 x 20 cm in size and cut it to the appropriate length.  We selected an antibiotic solution and then subsequently on the noncoated side place for equally space narrowing 0 Novafil sutures in the mesh and tied them down.  Then inserted the mesh through the large 11 mm cannula into the peritoneal cavity.  2 stab incisions at 4 equally spaced placed on the intra-abdominal wall using a suture retriever not to pull the sutures up to the abdominal wall.  These were subsequently tied down there to use a tacker to circumferentially tack the mesh on the anterior abdominal wall.  There was excellent coverage and at the gas was leaving the peritoneal cavity we proceeded the mesh was intact with no buckling or separation.  All needle counts, sponge counts, and instrument counts were correct.  We injected 0.25% Marcaine at all incision sites.  We closed with 4-0 Monocryl, Dermabond, Steri-Strips, and Tegaderm.  PATIENT DISPOSITION:  PACU - hemodynamically stable.   Jimmye Norman 5/30/20199:07 AM

## 2017-09-18 NOTE — H&P (Signed)
  Alison [name] Documented: 08/12/2017 10:05 AM Location: [facility] Patient #: [id number] DOB: Nov 10, 1945 Single / Language: [place] / Race: Black or African American Female   The patient is a 72 year old female.  Problem List/Past Medical Marcelino Duster R. Brooks, CMA; 08/12/2017 10:05 AM) INCISIONAL HERNIA, WITH OBSTRUCTION, WITHOUT GANGRENE (K43.0)   Past Surgical History Marcelino Duster R. Brooks, CMA; 08/12/2017 10:05 AM) Colon Polyp Removal - Colonoscopy  Gallbladder Surgery - Open  Knee Surgery  Left.  Diagnostic Studies History Marcelino Duster R. Brooks, CMA; 08/12/2017 10:05 AM) Colonoscopy  >10 years ago  Allergies Marcelino Duster R. Brooks, CMA; 08/12/2017 10:05 AM) CODEINE  PENICILLIN V  Latex   Medication History Marcelino Duster R. Brooks, CMA; 08/12/2017 10:05 AM) Diclofenac Sodium (  Tablet DR, Oral) Active. Clotrimazole-Betamethasone (1-0.05% Cream, External) Active. TiZANidine HCl (  Tablet, Oral) Active. Aspirin (  Tablet, Oral) Active. Januvia (  Tablet, Oral) Active. Lisinopril (  Tablet, Oral) Active. MetFORMIN HCl (  Tablet, Oral) Active. Triamterene-HCTZ (37.5-25MG  Capsule, Oral) Active. PARoxetine HCl (  Tablet, Oral) Active. Simvastatin (  Tablet, Oral) Active. Cyclobenzaprine HCl (  Tablet, Oral) Active. ZyrTEC Allergy (  Tablet, Oral) Active. Controlled Substance Active. ([place]. The patient is not showing habitual signs of abuse or chronic narcotic prescription use.) Medications Reconciled  Social History Marcelino Duster R. Brooks, CMA; 08/12/2017 10:05 AM) Caffeine use  Carbonated beverages, Coffee, Tea. No alcohol use  No drug use  Tobacco use  Never smoker.  Family History Marcelino Duster R. Shon Baton, CMA; 08/12/2017 10:05 AM) Alcohol Abuse  Brother, Daughter, Son. Anesthetic complications  Daughter. Bleeding disorder  Daughter. Cerebrovascular Accident  Daughter. Colon Polyps  Family Members In  General. Depression  Mother. Diabetes Mellitus  Family Members In General, Mother. Hypertension  Family Members In General. Kidney Disease  Brother. Seizure disorder  Daughter. Thyroid problems  Daughter, Family Members In General.  Other Problems Marcelino Duster R. Brooks, CMA; 08/12/2017 10:05 AM) Anxiety Disorder  Arthritis  Back Pain  Chest pain  Cholelithiasis  Depression  Diabetes Mellitus  Hemorrhoids  High blood pressure  Hypercholesterolemia  Ventral Hernia Repair   Vitals Marcelino Duster R. Brooks CMA; 08/12/2017 10:05 AM) 08/12/2017 10:05 AM Weight: 173.25 lb Height: 63in Body Surface Area: 1.82 m Body Mass Index: 30.69 kg/m  Pulse: 88 (Regular)  BP: 128/70 (Sitting, Left Arm, Standard) Vital signs are stable this AM       Physical Exam (Dajai Wahlert O. Emillio Ngo MD; 08/12/2017 10:30 AM) Chest and Lung Exam Chest and lung exam reveals -quiet, even and easy respiratory effort with no use of accessory muscles and on auscultation, normal breath sounds, no adventitious sounds and normal vocal resonance.  Abdomen Note: Patient had some nausea and vomiting after eating, with some blood in her emesis. I beleive this was not necessarily related to her ventral hernia  CT scan confirms the hernia as being 7 cm above the umbilicus. Fairly wide mouth Has some omentum in the hernia No changes in physical examination today.  Patient had  More questions about internal blockages and scar tissue that I answered satisfactorily.  Assessment & Plan Fayrene Fearing O. Judene Logue MD; 08/12/2017 10:35 AM) VENTRAL HERNIA WITHOUT OBSTRUCTION OR GANGRENE (K43.9) Impression: Has incarcerated omentum, no bowel. Patient more symptomatic, but not incarcerated. Wants to have it repaired.  Marta Lamas. Gae Bon, MD, FACS [phone] [facility]

## 2017-09-18 NOTE — Transfer of Care (Signed)
Immediate Anesthesia Transfer of Care Note  Patient: Alison [name]  Procedure(s) Performed: LAPAROSCOPIC VENTRAL HERNIA REPAIR WITH MESH (N/A Abdomen) INSERTION OF MESH (N/A )  Patient Location: PACU  Anesthesia Type:General  Level of Consciousness: drowsy  Airway & Oxygen Therapy: Patient Spontanous Breathing and Patient connected to face mask oxygen  Post-op Assessment: Report given to RN, Post -op Vital signs reviewed and stable and Patient moving all extremities X 4  Post vital signs: Reviewed and stable  Last Vitals:  Vitals Value Taken Time  BP 136/60 09/18/2017  9:25 AM  Temp    Pulse 80 09/18/2017  9:27 AM  Resp 21 09/18/2017  9:27 AM  SpO2 100 % 09/18/2017  9:27 AM  Vitals shown include unvalidated device data.  Last Pain:  Vitals:   09/18/17 0606  TempSrc: Oral  PainSc:          Complications: No apparent anesthesia complications

## 2017-09-18 NOTE — Anesthesia Preprocedure Evaluation (Signed)
Anesthesia Evaluation  Patient identified by MRN, date of birth, ID band Patient awake    Reviewed: Allergy & Precautions, NPO status , Patient's Chart, lab work & pertinent test results  Airway Mallampati: II  TM Distance: >3 FB Neck ROM: Full    Dental no notable dental hx.    Pulmonary neg pulmonary ROS,    Pulmonary exam normal breath sounds clear to auscultation       Cardiovascular hypertension, Pt. on medications negative cardio ROS Normal cardiovascular exam Rhythm:Regular Rate:Normal     Neuro/Psych  Headaches, Anxiety Depression negative neurological ROS  negative psych ROS   GI/Hepatic negative GI ROS, Neg liver ROS, GERD  ,  Endo/Other  negative endocrine ROSdiabetes, Type 2  Renal/GU negative Renal ROS  negative genitourinary   Musculoskeletal negative musculoskeletal ROS (+)   Abdominal   Peds negative pediatric ROS (+)  Hematology negative hematology ROS (+)   Anesthesia Other Findings   Reproductive/Obstetrics negative OB ROS                             Anesthesia Physical Anesthesia Plan  ASA: III  Anesthesia Plan: General   Post-op Pain Management:    Induction: Intravenous  PONV Risk Score and Plan: 3 and Ondansetron, Dexamethasone and Midazolam  Airway Management Planned: Oral ETT  Additional Equipment:   Intra-op Plan:   Post-operative Plan: Extubation in OR  Informed Consent: I have reviewed the patients History and Physical, chart, labs and discussed the procedure including the risks, benefits and alternatives for the proposed anesthesia with the patient or authorized representative who has indicated his/her understanding and acceptance.   Dental advisory given  Plan Discussed with: CRNA  Anesthesia Plan Comments:         Anesthesia Quick Evaluation

## 2017-09-18 NOTE — Anesthesia Procedure Notes (Signed)
Procedure Name: Intubation Date/Time: 09/18/2017 7:50 AM Performed by: Colin Benton, CRNA Pre-anesthesia Checklist: Patient identified, Emergency Drugs available, Suction available and Patient being monitored Patient Re-evaluated:Patient Re-evaluated prior to induction Oxygen Delivery Method: Circle system utilized Preoxygenation: Pre-oxygenation with 100% oxygen Induction Type: IV induction Ventilation: Mask ventilation without difficulty and Oral airway inserted - appropriate to patient size Laryngoscope Size: Mac and 3 Grade View: Grade II Tube type: Oral Tube size: 7.0 mm Number of attempts: 1 Airway Equipment and Method: Stylet Placement Confirmation: ETT inserted through vocal cords under direct vision,  positive ETCO2 and breath sounds checked- equal and bilateral Secured at: 21 cm Tube secured with: Tape Dental Injury: Teeth and Oropharynx as per pre-operative assessment

## 2017-09-19 ENCOUNTER — Encounter (HOSPITAL_COMMUNITY): Payer: Self-pay | Admitting: General Surgery

## 2017-10-06 ENCOUNTER — Encounter (HOSPITAL_COMMUNITY): Payer: Self-pay | Admitting: General Surgery

## 2017-12-08 ENCOUNTER — Encounter (HOSPITAL_COMMUNITY): Payer: Self-pay | Admitting: Emergency Medicine

## 2017-12-08 ENCOUNTER — Other Ambulatory Visit: Payer: Self-pay

## 2017-12-08 ENCOUNTER — Emergency Department (HOSPITAL_COMMUNITY)
Admission: EM | Admit: 2017-12-08 | Discharge: 2017-12-09 | Disposition: A | Discharge status: Home / Self Care | Payer: Medicare HMO | Attending: Emergency Medicine | Admitting: Emergency Medicine

## 2017-12-08 DIAGNOSIS — Y999 Unspecified external cause status: Secondary | ICD-10-CM | POA: Diagnosis not present

## 2017-12-08 DIAGNOSIS — Y929 Unspecified place or not applicable: Secondary | ICD-10-CM | POA: Insufficient documentation

## 2017-12-08 DIAGNOSIS — I10 Essential (primary) hypertension: Secondary | ICD-10-CM | POA: Insufficient documentation

## 2017-12-08 DIAGNOSIS — M5431 Sciatica, right side: Secondary | ICD-10-CM | POA: Insufficient documentation

## 2017-12-08 DIAGNOSIS — Z79899 Other long term (current) drug therapy: Secondary | ICD-10-CM | POA: Diagnosis not present

## 2017-12-08 DIAGNOSIS — Y939 Activity, unspecified: Secondary | ICD-10-CM | POA: Diagnosis not present

## 2017-12-08 DIAGNOSIS — Z7984 Long term (current) use of oral hypoglycemic drugs: Secondary | ICD-10-CM | POA: Diagnosis not present

## 2017-12-08 DIAGNOSIS — M79604 Pain in right leg: Secondary | ICD-10-CM

## 2017-12-08 DIAGNOSIS — S8991XA Unspecified injury of right lower leg, initial encounter: Secondary | ICD-10-CM | POA: Diagnosis present

## 2017-12-08 DIAGNOSIS — X509XXA Other and unspecified overexertion or strenuous movements or postures, initial encounter: Secondary | ICD-10-CM | POA: Diagnosis not present

## 2017-12-08 DIAGNOSIS — E119 Type 2 diabetes mellitus without complications: Secondary | ICD-10-CM | POA: Diagnosis not present

## 2017-12-08 NOTE — ED Triage Notes (Signed)
Pt reports R leg pain starting 2 weeks ago intermittently. Pt reports pain starts in her hip and and radiates down her leg. Pt denies any recent injury.

## 2017-12-09 DIAGNOSIS — M79604 Pain in right leg: Secondary | ICD-10-CM | POA: Diagnosis not present

## 2017-12-09 MED ORDER — METHOCARBAMOL 500 MG PO TABS: 500.0000 mg | ORAL_TABLET | Freq: Two times a day (BID) | ORAL | 0 refills | Status: AC

## 2017-12-09 MED ORDER — KETOROLAC TROMETHAMINE 15 MG/ML IJ SOLN
15.0000 mg | Freq: Once | INTRAMUSCULAR | Status: AC
  Administered 2017-12-09: 15 mg via INTRAMUSCULAR
  Filled 2017-12-09: qty 1

## 2017-12-09 NOTE — ED Provider Notes (Signed)
[facility] Provider Note   CSN: [id number] Arrival date & time: 12/08/17  2040     History   Chief Complaint Chief Complaint  [name] presents with  . Leg Pain    HPI TASHEBA [name] is a 72 y.o. female.  72 y.o female with a PMH of [name], [facility] [name], [facility] [name] reports she was playing with her grand babies on the floor when she stood up she experience pain along her right leg.  States the pain begins at her right buttocks and radiates down the back of her leg up to her knee.  Describes the pain as a tingling sensation that is constant without relief.  She reports no alleviating factors, states is very uncomfortable for her to sleep.  [name] has tried [facility] powder, [name] medication which she does not recall the name of and states no relief in symptoms.  Denies any bowel or bladder incontinence, fever, abdominal pain, chest pain or shortness of breath.     Past Medical History:  Diagnosis Date  . Allergy   . [facility] [name]   . [name]    and muscle spasms in both shoulders  . Cataract   . Depression   . Diabetes mellitus   . GERD (gastroesophageal reflux disease)    takes otc  . Headache    migraines when she was younger  . Hypertension   . Macular degeneration of both eyes     [name] Active Problem List   Diagnosis Date Noted  . Renal insufficiency 12/23/2012  . GERD 05/02/2008  . DYSPHAGIA UNSPECIFIED 01/27/2008  . DIABETES MELLITUS, TYPE II 11/27/2007  . [facility] [name] state, unspecified 11/27/2007  . HYPERTENSION 11/27/2007    Past Surgical History:  Procedure Laterality Date  . ABDOMINAL HYSTERECTOMY    . CATARACT EXTRACTION Bilateral 05/03/2013  . CHOLECYSTECTOMY    . EYE SURGERY    . INSERTION OF MESH N/A 09/18/2017   Procedure: INSERTION OF MESH;  Surgeon: Judeth Horn, MD;  Location: [place];  Service: General;  Laterality: N/A;  . KNEE ARTHROSCOPY Left    . TUBAL LIGATION    . VENTRAL HERNIA REPAIR N/A 09/18/2017   Procedure: LAPAROSCOPIC VENTRAL HERNIA REPAIR WITH MESH;  Surgeon: Judeth Horn, MD;  Location: [place];  Service: General;  Laterality: N/A;     OB History   None      Home Medications    Prior to Admission medications   Medication Sig Start Date End Date Taking? Authorizing Provider  ACCU-CHEK SOFTCLIX LANCETS lancets Test blood sugar 2 times a day. Dx code: 250.00 03/05/13   [facility] Kingdom, MD  aspirin EC 81 MG tablet Take 81 mg by mouth daily.    [provider]  Biotin 1000 MCG tablet Take 1,000 mcg by mouth every evening.    [provider]  Blood Glucose Monitoring Suppl (ACCU-CHEK NANO SMARTVIEW) W/DEVICE KIT Test blood sugar twice daily as instructed. 06/01/13   [facility] Kingdom, MD  Cholecalciferol (VITAMIN D3) 5000 units CAPS Take 5,000 Units by mouth every evening.    [provider]  diclofenac (VOLTAREN) 75 MG EC tablet Take 75 mg by mouth 2 (two) times daily. 08/15/17   [provider]  diphenhydrAMINE (BENADRYL) 25 MG tablet Take 1 tablet (25 mg total) by mouth every 6 (six) hours. [name] taking differently: Take 25 mg by mouth every 6 (six) hours as needed for allergies.  07/01/12   [facility] Flemings, MD  fluticasone (FLONASE) 50 MCG/ACT nasal spray Place 1 spray into both nostrils daily as needed for allergies.    [provider]  glucose blood (ACCU-CHEK SMARTVIEW) test strip Use to test blood sugar 2 times daily as instructed. Dx code: 250.00 06/03/13   [facility] Kingdom, MD  JANUVIA 100 MG tablet TAKE 1 TABLET (100 MG TOTAL) BY MOUTH DAILY. [name] taking differently: TAKE 1 TABLET (100 MG TOTAL) BY MOUTH DAILY AT NIGHT. 04/19/13   [facility] Kingdom, MD  lisinopril (PRINIVIL,ZESTRIL) 20 MG tablet Take 20 mg by mouth every evening. 08/09/17   [provider]  metFORMIN (GLUCOPHAGE) 500 MG tablet Take 500 mg by mouth 2 (two) times daily. 07/28/17   [provider]  Multiple Vitamin (MULTIVITAMIN WITH MINERALS) TABS tablet Take 1 tablet by mouth daily.    [provider]  Multiple Vitamins-Minerals (OCUVITE PO) Take 1 tablet by mouth daily.    [provider]  oxyCODONE (OXY IR/ROXICODONE) 5 MG immediate release tablet Take 1 tablet (5 mg total) by mouth every 6 (six) hours as needed for severe pain. 09/18/17   Judeth Horn, MD  oxyCODONE (OXY IR/ROXICODONE) 5 MG immediate release tablet Take 1 tablet (5 mg total) by mouth every 6 (six) hours as needed for moderate pain, severe pain or breakthrough pain. 09/18/17   Judeth Horn, MD  PARoxetine (PAXIL) 30 MG tablet Take 30 mg by mouth daily.    [provider]  simvastatin (ZOCOR) 10 MG tablet Take 10 mg by mouth at bedtime. 09/01/17   [provider]  tiZANidine (ZANAFLEX) 4 MG tablet Take 4 mg by mouth 2 (two) times daily as needed for spasms. 06/07/17   [provider]    Family History Family History  Problem Relation Age of Onset  . Colon cancer Neg Hx     Social History Social History   Tobacco Use  . Smoking status: Never Smoker  . Smokeless tobacco: Never Used  Substance Use Topics  . Alcohol use: No  . Drug use: No     Allergies   Codeine and Penicillins   Review of Systems Review of Systems  Constitutional: Negative for chills and fever.  HENT: Negative for ear pain and sore throat.   Eyes: Negative for pain and visual disturbance.  Respiratory: Negative for cough and shortness of breath.   Cardiovascular: Negative for chest pain and palpitations.  Gastrointestinal: Negative for abdominal pain and vomiting.  Genitourinary: Negative for dysuria and hematuria.  Musculoskeletal: Positive for myalgias. Negative for arthralgias and back pain.  Skin: Negative for color change and rash.  Neurological: Negative for seizures and syncope.  All other systems reviewed and are negative.    Physical Exam Updated Vital Signs BP  133/88 (BP Location: Right Arm)   Pulse 88   Temp 98.8 F (37.1 C) (Oral)   Resp 16   Ht '5\' 3"'  (1.6 m)   Wt 79.8 kg   SpO2 100%   BMI 31.18 kg/m   Physical Exam  Constitutional: She is oriented to person, place, and time. She appears well-developed and well-nourished. No distress.  HENT:  Head: Normocephalic and atraumatic.  Mouth/Throat: Oropharynx is clear and moist. No oropharyngeal exudate.  Eyes: Pupils are equal, round, and reactive to light.  Neck: Normal range of motion.  Cardiovascular: Regular rhythm and normal heart sounds.  Pulmonary/Chest: Effort normal and breath sounds normal. No respiratory distress.  Abdominal: Soft. Bowel sounds are normal. She exhibits  no distension. There is no tenderness.  Musculoskeletal: She exhibits no tenderness or deformity.       Lumbar back: She exhibits edema, pain and spasm. She exhibits no bony tenderness, no swelling, no deformity, no laceration and normal pulse.       Back:       Right lower leg: She exhibits no edema.       Left lower leg: She exhibits no edema.  Tenderness with deep palpation of the SI joint.  Upon palpating [name] states the tingling radiates down her leg.  Neurological: She is alert and oriented to person, place, and time.  Skin: Skin is warm and dry.  Psychiatric: She has a normal mood and affect.  Nursing note and vitals reviewed.    ED Treatments / Results  Labs (all labs ordered are listed, but only abnormal results are displayed) Labs Reviewed - No data to display  EKG None  Radiology No results found.  Procedures Procedures (including critical care time)  Medications Ordered in ED Medications  ketorolac (TORADOL) 15 MG/ML injection 15 mg (15 mg Intramuscular Given 12/09/17 0029)     Initial Impression / Assessment and Plan / ED Course  I have reviewed the triage vital signs and the nursing notes.  Pertinent labs & imaging results that were available during my care of the [name] were  reviewed by me and considered in my medical decision making (see chart for details).      Presents with leg pain x3 weeks.  Scribes the pain is radiating from her right buttocks all the way to behind her knee.  She has tried [facility] powder and states no relieving symptoms.  On examination there is tenderness to palpation of the SI joint [name] feels tingling going down her leg.  Shot of Toradol given in the ED for [name] comfort, [name] states she feels better.  I have advised [name] I will send her home with a prescription for muscle relaxers she should rest and apply heat to the area. Final Clinical Impressions(s) / ED Diagnoses   Final diagnoses:  Right leg pain  Sciatica of right side    ED Discharge Orders    None       Janeece Fitting, PA-C 12/09/17 0059    Noemi Chapel, MD 12/10/17 [phone]

## 2017-12-09 NOTE — Discharge Instructions (Addendum)
I have prescribed muscle relaxers for pain, these do not drink or drive while taking this medication.  May apply heat/ice to the area.  Follow-up with PCP in 1 week for reevaluation of your symptoms.

## 2017-12-09 NOTE — ED Notes (Signed)
Patient verbalizes understanding of discharge instructions. Opportunity for questioning and answers were provided. Armband removed by staff, pt discharged from ED in wheelchair.  

## 2017-12-17 ENCOUNTER — Encounter (HOSPITAL_COMMUNITY): Payer: Self-pay

## 2017-12-17 ENCOUNTER — Ambulatory Visit (HOSPITAL_COMMUNITY)
Admission: EM | Admit: 2017-12-17 | Discharge: 2017-12-17 | Disposition: A | Discharge status: Home / Self Care | Payer: Medicare HMO | Attending: Family Medicine | Admitting: Family Medicine

## 2017-12-17 ENCOUNTER — Other Ambulatory Visit: Payer: Self-pay

## 2017-12-17 ENCOUNTER — Ambulatory Visit (INDEPENDENT_AMBULATORY_CARE_PROVIDER_SITE_OTHER): Payer: Medicare HMO

## 2017-12-17 DIAGNOSIS — M545 Low back pain: Secondary | ICD-10-CM | POA: Diagnosis not present

## 2017-12-17 DIAGNOSIS — M5441 Lumbago with sciatica, right side: Secondary | ICD-10-CM | POA: Diagnosis not present

## 2017-12-17 MED ORDER — KETOROLAC TROMETHAMINE 30 MG/ML IJ SOLN
30.0000 mg | Freq: Once | INTRAMUSCULAR | Status: AC
  Administered 2017-12-17: 30 mg via INTRAMUSCULAR

## 2017-12-17 MED ORDER — PREDNISONE 50 MG PO TABS: 50.0000 mg | ORAL_TABLET | Freq: Every day | ORAL | 0 refills | Status: AC

## 2017-12-17 MED ORDER — CYCLOBENZAPRINE HCL 5 MG PO TABS: 10.0000 mg | ORAL_TABLET | Freq: Two times a day (BID) | ORAL | 0 refills | Status: AC | PRN

## 2017-12-17 MED ORDER — KETOROLAC TROMETHAMINE 30 MG/ML IJ SOLN
INTRAMUSCULAR | Status: AC
  Filled 2017-12-17: qty 1

## 2017-12-17 NOTE — Discharge Instructions (Signed)
We gave you a shot of Toradol today, this should begin working about 30 to 40 minutes.  Please begin taking prednisone daily with food for the next 5 days  Please use Flexeril at bedtime, this will cause sedation, please do not use during the day or when you need to drive  Please follow-up if symptoms continuing to not improve with treatment or worsening.

## 2017-12-17 NOTE — ED Provider Notes (Signed)
[name]    CSN: [id number] Arrival date & time: 12/17/17  1140     History   Chief Complaint Chief Complaint  Patient presents with  . Back Pain    HPI [facility] [name] is a 72 y.o. female history of hypertension, headache, DM type II, arthritis presenting today for evaluation of right-sided sciatica.  Patient states that she has had back pain that radiates down to her right ankle for the past 3 weeks.  She was seen in the emergency room over a week ago and was prescribed Robaxin.  She has been taking this and not had any relief.  She continues to endorse significant pain especially with standing, certain movements as well as lying flat.  Denies any numbness or tingling.  Denies saddle anesthesia.  Denies loss of bowel or bladder control.  Denies chest pain or shortness of breath.  Denies lower leg swelling.  Denies previous issues with her back.  Denies any specific injury or increase in activity.  She does note 2 days prior to onset she was playing with her granddaughter on the floor.  HPI  Past Medical History:  Diagnosis Date  . Allergy   . Anxiety   . Arthritis    and muscle spasms in both shoulders  . Cataract   . Depression   . Diabetes mellitus   . GERD (gastroesophageal reflux disease)    takes otc  . Headache    migraines when she was younger  . Hypertension   . Macular degeneration of both eyes     Patient Active Problem List   Diagnosis Date Noted  . Renal insufficiency 12/23/2012  . GERD 05/02/2008  . DYSPHAGIA UNSPECIFIED 01/27/2008  . DIABETES MELLITUS, TYPE II 11/27/2007  . Anxiety state, unspecified 11/27/2007  . HYPERTENSION 11/27/2007    Past Surgical History:  Procedure Laterality Date  . ABDOMINAL HYSTERECTOMY    . CATARACT EXTRACTION Bilateral 05/03/2013  . CHOLECYSTECTOMY    . EYE SURGERY    . INSERTION OF MESH N/A 09/18/2017   Procedure: INSERTION OF MESH;  Surgeon: Judeth Horn, MD;  Location: [place];  Service: General;   Laterality: N/A;  . KNEE ARTHROSCOPY Left   . TUBAL LIGATION    . VENTRAL HERNIA REPAIR N/A 09/18/2017   Procedure: LAPAROSCOPIC VENTRAL HERNIA REPAIR WITH MESH;  Surgeon: Judeth Horn, MD;  Location: [place];  Service: General;  Laterality: N/A;    OB History   None      Home Medications    Prior to Admission medications   Medication Sig Start Date End Date Taking? Authorizing Provider  ACCU-CHEK SOFTCLIX LANCETS lancets Test blood sugar 2 times a day. Dx code: 250.00 03/05/13   Philemon Kingdom, MD  aspirin EC 81 MG tablet Take 81 mg by mouth daily.    [provider]  Biotin 1000 MCG tablet Take 1,000 mcg by mouth every evening.    [provider]  Blood Glucose Monitoring Suppl (ACCU-CHEK NANO SMARTVIEW) W/DEVICE KIT Test blood sugar twice daily as instructed. 06/01/13   Philemon Kingdom, MD  Cholecalciferol (VITAMIN D3) 5000 units CAPS Take 5,000 Units by mouth every evening.    [provider]  cyclobenzaprine (FLEXERIL) 5 MG tablet Take 2 tablets (10 mg total) by mouth 2 (two) times daily as needed for muscle spasms. 12/17/17   Moraima Burd C, PA-C  diclofenac (VOLTAREN) 75 MG EC tablet Take 75 mg by mouth 2 (two) times daily. 08/15/17   [provider]  diphenhydrAMINE (BENADRYL) 25 MG tablet Take 1 tablet (25 mg total) by mouth every 6 (six) hours. Patient taking differently: Take 25 mg by mouth every 6 (six) hours as needed for allergies.  07/01/12   Linton Flemings, MD  fluticasone (FLONASE) 50 MCG/ACT nasal spray Place 1 spray into both nostrils daily as needed for allergies.    [provider]  glucose blood (ACCU-CHEK SMARTVIEW) test strip Use to test blood sugar 2 times daily as instructed. Dx code: 250.00 06/03/13   Philemon Kingdom, MD  JANUVIA 100 MG tablet TAKE 1 TABLET (100 MG TOTAL) BY MOUTH DAILY. Patient taking differently: TAKE 1 TABLET (100 MG TOTAL) BY MOUTH DAILY AT NIGHT. 04/19/13   Philemon Kingdom, MD  lisinopril  (PRINIVIL,ZESTRIL) 20 MG tablet Take 20 mg by mouth every evening. 08/09/17   [provider]  metFORMIN (GLUCOPHAGE) 500 MG tablet Take 500 mg by mouth 2 (two) times daily. 07/28/17   [provider]  Multiple Vitamin (MULTIVITAMIN WITH MINERALS) TABS tablet Take 1 tablet by mouth daily.    [provider]  Multiple Vitamins-Minerals (OCUVITE PO) Take 1 tablet by mouth daily.    [provider]  oxyCODONE (OXY IR/ROXICODONE) 5 MG immediate release tablet Take 1 tablet (5 mg total) by mouth every 6 (six) hours as needed for severe pain. 09/18/17   Judeth Horn, MD  oxyCODONE (OXY IR/ROXICODONE) 5 MG immediate release tablet Take 1 tablet (5 mg total) by mouth every 6 (six) hours as needed for moderate pain, severe pain or breakthrough pain. 09/18/17   Judeth Horn, MD  PARoxetine (PAXIL) 30 MG tablet Take 30 mg by mouth daily.    [provider]  predniSONE (DELTASONE) 50 MG tablet Take 1 tablet (50 mg total) by mouth daily with breakfast for 5 days. 12/17/17 12/22/17  Brylan Dec C, PA-C  simvastatin (ZOCOR) 10 MG tablet Take 10 mg by mouth at bedtime. 09/01/17   [provider]  tiZANidine (ZANAFLEX) 4 MG tablet Take 4 mg by mouth 2 (two) times daily as needed for spasms. 06/07/17   [provider]    Family History Family History  Problem Relation Age of Onset  . Colon cancer Neg Hx     Social History Social History   Tobacco Use  . Smoking status: Never Smoker  . Smokeless tobacco: Never Used  Substance Use Topics  . Alcohol use: No  . Drug use: No     Allergies   Codeine and Penicillins   Review of Systems Review of Systems  Constitutional: Negative for fatigue and fever.  Eyes: Negative for visual disturbance.  Respiratory: Negative for shortness of breath.   Cardiovascular: Negative for chest pain.  Gastrointestinal: Negative for abdominal pain, nausea and vomiting.  Musculoskeletal: Positive for back pain, gait  problem and myalgias. Negative for arthralgias and joint swelling.  Skin: Negative for color change, rash and wound.  Neurological: Negative for dizziness, weakness, light-headedness and headaches.     Physical Exam Triage Vital Signs ED Triage Vitals  Enc Vitals Group     BP 12/17/17 1205 135/66     Pulse Rate 12/17/17 1205 83     Resp 12/17/17 1205 16     Temp 12/17/17 1205 98 F (36.7 C)     Temp src --      SpO2 12/17/17 1205 98 %     Weight 12/17/17 1203 177 lb (80.3 kg)     Height --      Head Circumference --  Peak Flow --      Pain Score 12/17/17 1203 10     Pain Loc --      Pain Edu? --      Excl. in [place]? --    No data found.  Updated Vital Signs BP 135/66   Pulse 83   Temp 98 F (36.7 C)   Resp 16   Wt 177 lb (80.3 kg)   SpO2 98%   BMI 31.35 kg/m   Visual Acuity Right Eye Distance:   Left Eye Distance:   Bilateral Distance:    Right Eye Near:   Left Eye Near:    Bilateral Near:     Physical Exam  Constitutional: She appears well-developed and well-nourished. No distress.  HENT:  Head: Normocephalic and atraumatic.  Eyes: Conjunctivae are normal.  Neck: Neck supple.  Cardiovascular: Normal rate and regular rhythm.  No murmur heard. Pulmonary/Chest: Effort normal and breath sounds normal. No respiratory distress.  Abdominal: Soft. There is no tenderness.  Musculoskeletal: She exhibits no edema.  Nontender to palpation of cervical and thoracic spine midline, tenderness to palpation of the lumbar spine midline as well as right lateral musculature.  Positive straight leg raise on right.  Strength 5/5 and equal bilaterally at hips and knees, patellar reflex 2+ bilaterally.  No leg swelling or calf tenderness.  Neurological: She is alert.  Skin: Skin is warm and dry.  Psychiatric: She has a normal mood and affect.  Nursing note and vitals reviewed.    UC Treatments / Results  Labs (all labs ordered are listed, but only abnormal results  are displayed) Labs Reviewed - No data to display  EKG None  Radiology Dg Lumbar Spine Complete  Result Date: 12/17/2017 CLINICAL DATA:  Three-week history of low back pain. No known injuries. EXAM: LUMBAR SPINE - COMPLETE 4+ VIEW COMPARISON:  Bone window images from CT abdomen and pelvis 07/09/2017 and 08/11/2005. Lumbar spine x-rays 05/25/2014. FINDINGS: Five non-rib-bearing lumbar vertebrae. Grade 1 spondylolisthesis of L4 on L5 measuring approximately 7 mm, unchanged since the CT 5 months ago, new since the lumbar spine x-rays in 2016, related to severe facet degenerative changes at this level. Facet degenerative changes at L3-4 and L5-S1 as well, unchanged since the recent CT. Moderate disc space narrowing at L3-4 and L4-5, unchanged, with vacuum phenomenon at L4-5. Degenerative disc disease and spondylosis involving the visualized LOWER thoracic spine, unchanged. No pars defects. Sacroiliac joints intact. Aortoiliac atherosclerosis without evidence of aneurysm. IMPRESSION: 1. No acute or subacute osseous abnormalities. 2. Moderate degenerative disc disease at L3-4 and L4-5, stable since the prior abdominopelvic CT from March, 2019. 3. Degenerative grade 1 spondylolisthesis of L4 on L5 measuring approximately 7 mm, also stable. 4. Facet degenerative changes at L3-4, L4-5 and L5-S1, also stable. 5. Degenerative disc disease and spondylosis involving the LOWER thoracic spine, also unchanged. 6.  Aortic Atherosclerosis (ICD10-170.0) Electronically Signed   By: Evangeline Dakin M.D.   On: 12/17/2017 13:08    Procedures Procedures (including critical care time)  Medications Ordered in UC Medications  ketorolac (TORADOL) 30 MG/ML injection 30 mg (has no administration in time range)    Initial Impression / Assessment and Plan / UC Course  I have reviewed the triage vital signs and the nursing notes.  Pertinent labs & imaging results that were available during my care of the patient were  reviewed by me and considered in my medical decision making (see chart for details).     Patient symptoms concerning  for her low back pain with an radicular distribution.  Will get lumbar x-ray given persistence of symptoms for 3 weeks without improvement on muscle relaxers.  X-ray showing degenerative changes throughout lumbar spine.  Likely cause of sciatica.  Will provide patient with Toradol 30 mg in clinic today, will send home to continue prednisone for anti-inflammatory effect for 5 days.  Provided Flexeril 5 mg as alternative to use at bedtime.Discussed strict return precautions. Patient verbalized understanding and is agreeable with plan.  Final Clinical Impressions(s) / UC Diagnoses   Final diagnoses:  Acute right-sided low back pain with right-sided sciatica     Discharge Instructions     We gave you a shot of Toradol today, this should begin working about 30 to 40 minutes.  Please begin taking prednisone daily with food for the next 5 days  Please use Flexeril at bedtime, this will cause sedation, please do not use during the day or when you need to drive  Please follow-up if symptoms continuing to not improve with treatment or worsening.    ED Prescriptions    Medication Sig Dispense Auth. Provider   predniSONE (DELTASONE) 50 MG tablet Take 1 tablet (50 mg total) by mouth daily with breakfast for 5 days. 5 tablet Jaynia Fendley C, PA-C   cyclobenzaprine (FLEXERIL) 5 MG tablet Take 2 tablets (10 mg total) by mouth 2 (two) times daily as needed for muscle spasms. 20 tablet Chasmine Lender, [place] C, PA-C     Controlled Substance Prescriptions [place] Controlled Substance Registry consulted? Not Applicable   Janith Lima, [place] 12/17/17 1315

## 2017-12-17 NOTE — ED Triage Notes (Signed)
Pt states she has sciatica pain 10 days

## 2019-05-06 ENCOUNTER — Telehealth (HOSPITAL_COMMUNITY): Payer: Self-pay

## 2019-05-06 NOTE — Telephone Encounter (Signed)

## 2019-05-07 ENCOUNTER — Encounter: Payer: Self-pay | Admitting: Vascular Surgery

## 2019-05-07 ENCOUNTER — Other Ambulatory Visit: Payer: Self-pay

## 2019-05-07 ENCOUNTER — Ambulatory Visit (INDEPENDENT_AMBULATORY_CARE_PROVIDER_SITE_OTHER): Payer: Medicare Other | Admitting: Vascular Surgery

## 2019-05-07 VITALS — BP 139/81 | HR 82 | Temp 98.1°F | Resp 20 | Ht 63.0 in | Wt 179.8 lb

## 2019-05-07 DIAGNOSIS — I728 Aneurysm of other specified arteries: Secondary | ICD-10-CM

## 2019-05-07 NOTE — Progress Notes (Signed)
Patient ID: Alison [name], female   DOB: 12/31/1945, 74 y.o.   MRN: [id number]  Reason for Consult: New Patient (Initial Visit)   Referred by Nolene Ebbs, MD  Subjective:     HPI:  Alison [name] is a 74 y.o. female without significant vascular history.  She was recently in [place] was found to have shortness of breath was taken for CT scan did not have a PE.  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  She does not have any abdominal pain.  She does have a daughter that had an aneurysm she is unsure where.  She has never had any other aneurysmal disease.  Denies stroke.  Denies TIA or amaurosis.  She denies any previous history of heart disease.  Risk factors for vascular disease include hypertension, hyperlipidemia and diabetes.  She is a lifelong non-smoker.  Past Medical History:  Diagnosis Date  . Allergy   . Anxiety   . Arthritis    and muscle spasms in both shoulders  . Cataract   . Depression   . Diabetes mellitus   . GERD (gastroesophageal reflux disease)    takes otc  . Headache    migraines when she was younger  . Hyperlipidemia   . Hypertension   . Macular degeneration of both eyes    Family History  Problem Relation Age of Onset  . Colon cancer Neg Hx    Past Surgical History:  Procedure Laterality Date  . ABDOMINAL HYSTERECTOMY    . CATARACT EXTRACTION Bilateral 05/03/2013  . CHOLECYSTECTOMY    . EYE SURGERY    . INSERTION OF MESH N/A 09/18/2017   Procedure: INSERTION OF MESH;  Surgeon: Judeth Horn, MD;  Location: [facility];  Service: General;  Laterality: N/A;  . KNEE ARTHROSCOPY Left   . TUBAL LIGATION    . VENTRAL HERNIA REPAIR N/A 09/18/2017   Procedure: LAPAROSCOPIC VENTRAL HERNIA REPAIR WITH MESH;  Surgeon: Judeth Horn, MD;  Location: [facility];  Service: General;  Laterality: N/A;    Short Social History:  Social History   Tobacco Use  . Smoking status: Never Smoker  . Smokeless tobacco: Never Used  Substance Use  Topics  . Alcohol use: No    Allergies  Allergen Reactions  . Codeine Hives  . Penicillins Hives and Other (See Comments)    PATIENT HAS HAD A PCN REACTION WITH IMMEDIATE RASH, FACIAL/TONGUE/THROAT SWELLING, SOB, OR LIGHTHEADEDNESS WITH HYPOTENSION:  #  #  YES  #  #  Has patient had a PCN reaction causing severe rash involving mucus membranes or skin necrosis: NO Has patient had a PCN reaction that required hospitalizationNO Has patient had a PCN reaction occurring within the last 10 years: NO     Current Outpatient Medications  Medication Sig Dispense Refill  . ACCU-CHEK SOFTCLIX LANCETS lancets Test blood sugar 2 times a day. Dx code: 250.00 100 each 5  . aspirin EC 81 MG tablet Take 81 mg by mouth daily.    . Aspirin-Salicylamide-Caffeine (ARTHRITIS STRENGTH BC POWDER PO) Take by mouth.    . Biotin 1000 MCG tablet Take 1,000 mcg by mouth every evening.    . Blood Glucose Monitoring Suppl (ACCU-CHEK NANO SMARTVIEW) W/DEVICE KIT Test blood sugar twice daily as instructed. 1 kit 0  . Cholecalciferol (VITAMIN D3) 5000 units CAPS Take 5,000 Units by mouth every evening.    . cyclobenzaprine (FLEXERIL) 5 MG tablet Take 2 tablets (10 mg  total) by mouth 2 (two) times daily as needed for muscle spasms. 20 tablet 0  . diclofenac Sodium (VOLTAREN) 1 % GEL Apply topically 4 (four) times daily.    . diphenhydrAMINE (BENADRYL) 25 MG tablet Take 1 tablet (25 mg total) by mouth every 6 (six) hours. (Patient taking differently: Take 25 mg by mouth every 6 (six) hours as needed for allergies. ) 20 tablet 0  . fluticasone (FLONASE) 50 MCG/ACT nasal spray Place 1 spray into both nostrils daily as needed for allergies.    Marland Kitchen glucose blood (ACCU-CHEK SMARTVIEW) test strip Use to test blood sugar 2 times daily as instructed. Dx code: 250.00 200 each 2  . JANUVIA 100 MG tablet TAKE 1 TABLET (100 MG TOTAL) BY MOUTH DAILY. (Patient taking differently: TAKE 1 TABLET (100 MG TOTAL) BY MOUTH DAILY AT NIGHT.) 30  tablet 3  . lisinopril (PRINIVIL,ZESTRIL) 20 MG tablet Take 20 mg by mouth every evening.  2  . metFORMIN (GLUCOPHAGE) 500 MG tablet Take 500 mg by mouth 2 (two) times daily.  1  . Multiple Vitamin (MULTIVITAMIN WITH MINERALS) TABS tablet Take 1 tablet by mouth daily.    . Multiple Vitamins-Minerals (OCUVITE PO) Take 1 tablet by mouth daily.    Marland Kitchen PARoxetine (PAXIL) 30 MG tablet Take 30 mg by mouth daily.    . simvastatin (ZOCOR) 10 MG tablet Take 10 mg by mouth at bedtime.  1  . tiZANidine (ZANAFLEX) 4 MG tablet Take 4 mg by mouth 2 (two) times daily as needed for spasms.  2  . vitamin E 200 UNIT capsule Take 200 Units by mouth daily.     No current facility-administered medications for this visit.    Review of Systems  Constitutional:  Constitutional negative. HENT: HENT negative.  Eyes: Eyes negative.  Respiratory: Respiratory negative.  Cardiovascular: Cardiovascular negative.  GI: Gastrointestinal negative.  Musculoskeletal: Positive for leg pain.  Skin: Skin negative.  Neurological: Neurological negative. Hematologic: Hematologic/lymphatic negative.  Psychiatric: Psychiatric negative.        Objective:  Objective   Vitals:   05/07/19 1158  BP: 139/81  Pulse: 82  Resp: 20  Temp: 98.1 F (36.7 C)  SpO2: 98%  Weight: 179 lb 12.8 oz (81.6 kg)  Height: '5\' 3"'  (1.6 m)   Body mass index is 31.85 kg/m.  Physical Exam Constitutional:      Appearance: Normal appearance.  HENT:     Head: Normocephalic.     Nose: Nose normal.     Mouth/Throat:     Mouth: Mucous membranes are moist.  Eyes:     Pupils: Pupils are equal, round, and reactive to light.  Neck:     Vascular: No carotid bruit.  Cardiovascular:     Pulses: Normal pulses.  Abdominal:     General: Abdomen is flat.     Palpations: Abdomen is soft. There is no mass.  Musculoskeletal:        General: No swelling. Normal range of motion.  Skin:    General: Skin is warm.     Capillary Refill: Capillary  refill takes less than 2 seconds.  Neurological:     General: No focal deficit present.     Mental Status: She is alert.  Psychiatric:        Mood and Affect: Mood normal.        Behavior: Behavior normal.        Thought Content: Thought content normal.        Judgment: Judgment normal.  Data: I reviewed the report from [place] which demonstrates 8 to 9 mm calcified splenic artery aneurysm.  This is consistent with her previous CT scan in March 2019 in our system.     Assessment/Plan:     74 year old female presents for evaluation splenic artery aneurysm.  This is less than 1 cm patient is beyond childbearing years.  This is stable from previous scan in our system.  At this time does not need further follow-up and can be seen on an as-needed basis.     Waynetta Sandy MD [facility]

## 2020-03-05 IMAGING — CT CT ABD-PELV W/ CM
1 of 3 series · 10 of 32 positions shown, 16 images · IV contrast (APPLIED)
Comparison: CT abdomen pelvis of 08/11/2005

CLINICAL DATA: Evaluate for ventral hernia, occasional abdominal
pain and nausea, minimal weight loss

EXAM:
CT ABDOMEN AND PELVIS WITH CONTRAST
TECHNIQUE: Multidetector CT imaging of the abdomen and pelvis was performed
using the standard protocol following bolus administration of
intravenous contrast.
CONTRAST:  100mL 6E2R6C-FEE IOPAMIDOL (6E2R6C-FEE) INJECTION 61%
Creatinine was obtained on site at [HOSPITAL] at [HOSPITAL].
Results: Creatinine 1.0 mg/dL.  GFR of 66.

[Series 2: abd/pelvis w/cm · axial · 0.77mm/px · z∈[-428,-53]mm · 10 of 93 slices shown, 16 images]
[im 9/93  soft-tissue]
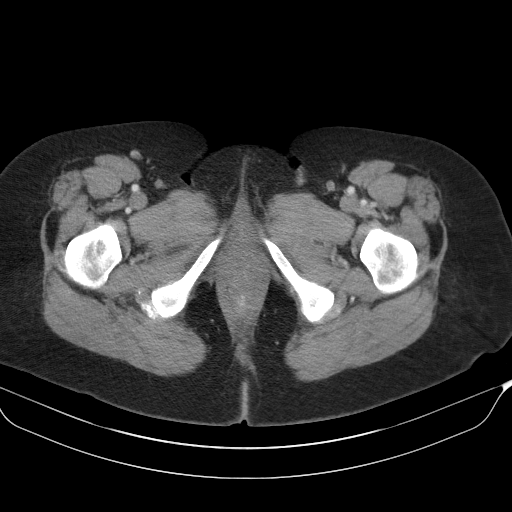
[im 9/93  bone]
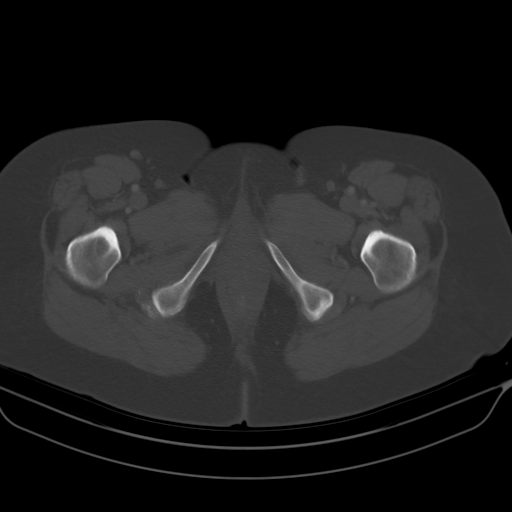
[im 17/93  soft-tissue]
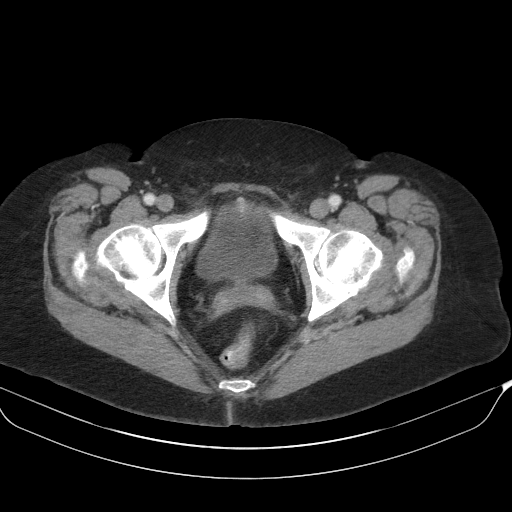
[im 26/93  soft-tissue]
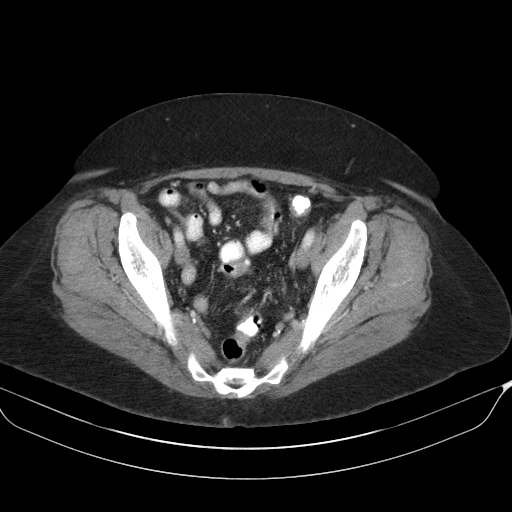
[im 34/93  soft-tissue]
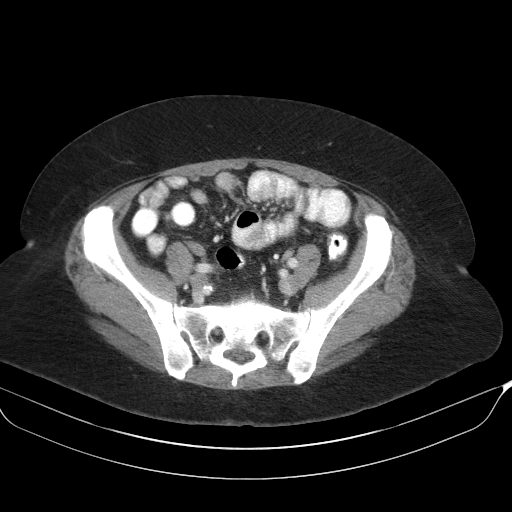
[im 42/93  soft-tissue]
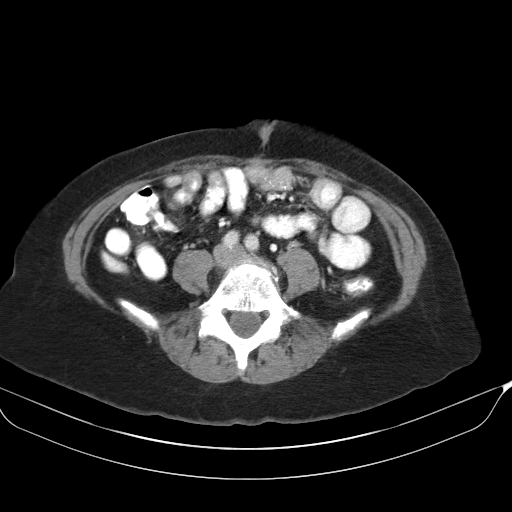
[im 51/93  soft-tissue]
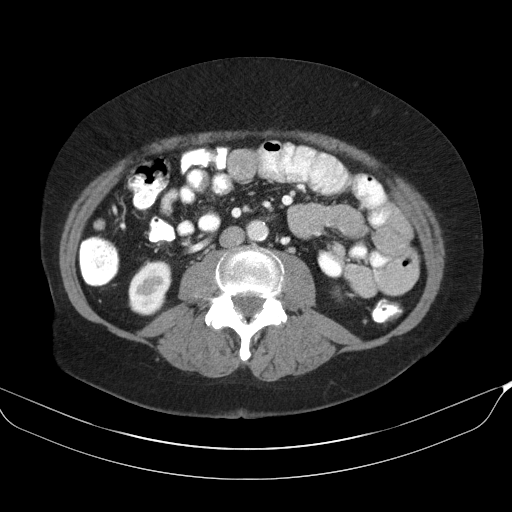
[im 59/93  soft-tissue]
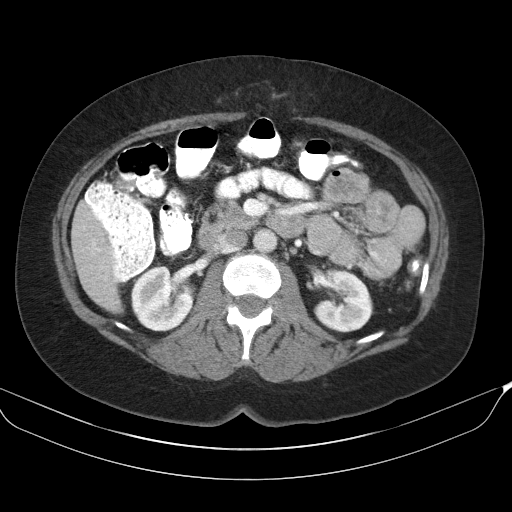
[im 59/93  lung]
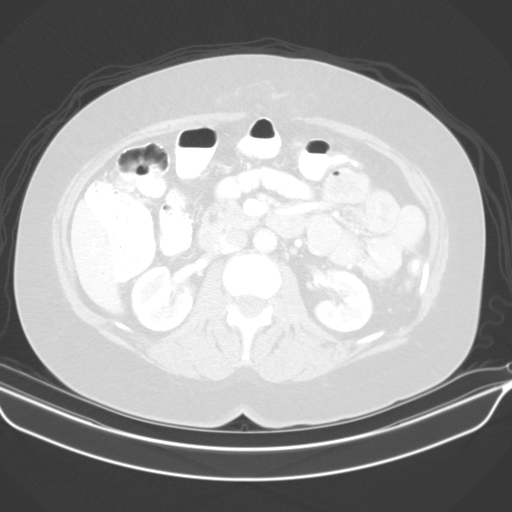
[im 67/93  soft-tissue]
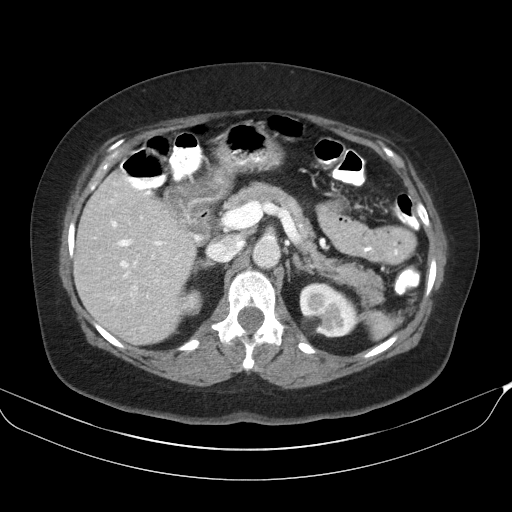
[im 67/93  lung]
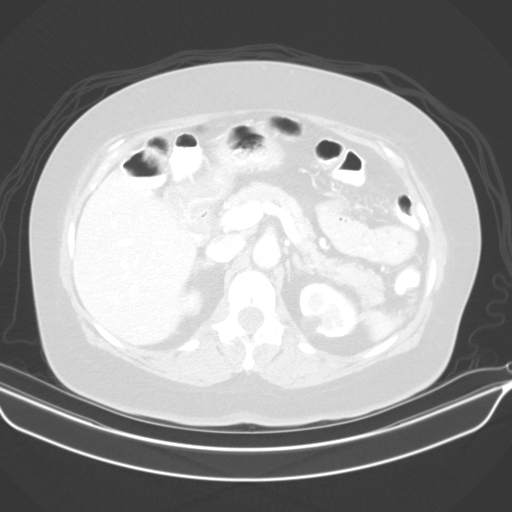
[im 76/93  soft-tissue]
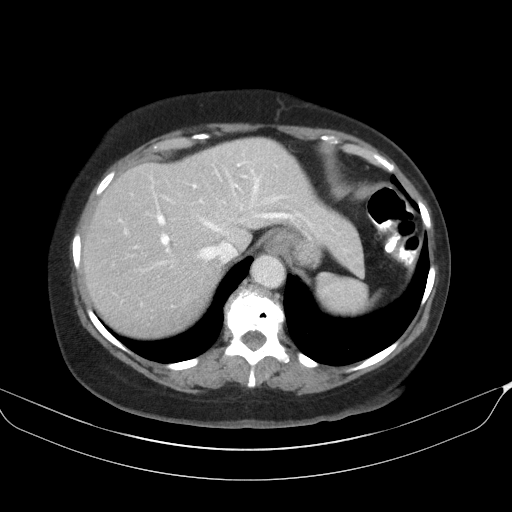
[im 76/93  lung]
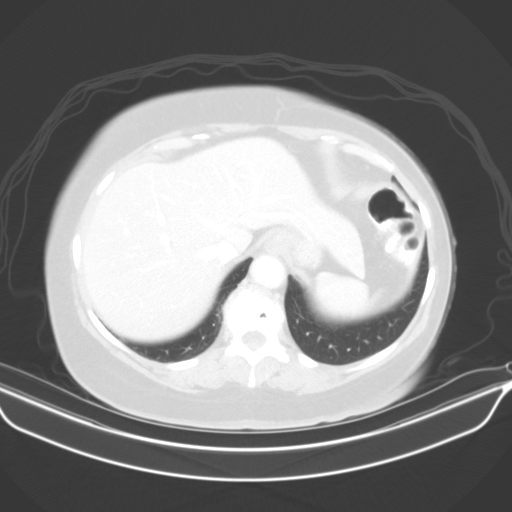
[im 76/93  bone]
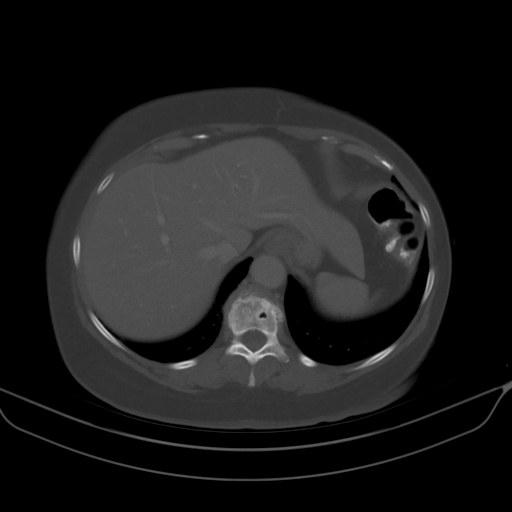
[im 84/93  soft-tissue]
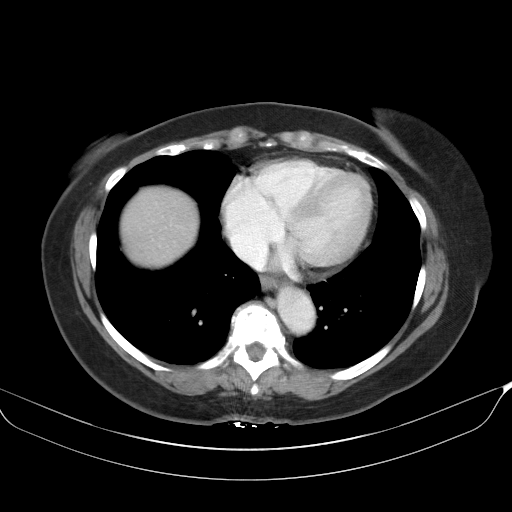
[im 84/93  lung]
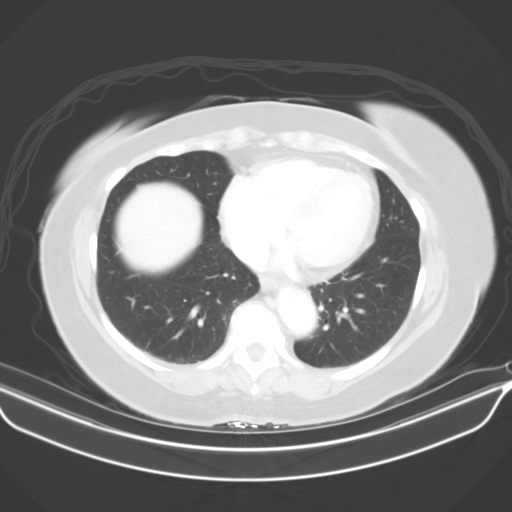

[10 of 32 positions shown; findings below may reference images not displayed]

FINDINGS: Lower chest: The lung bases are clear. The heart is mildly enlarged.
No pericardial effusion is seen.

Hepatobiliary: The liver enhances with no focal abnormality and no
ductal dilatation is seen. The gallbladder has previously been
resected.

Pancreas: The pancreas is normal in size and the pancreatic duct is
not dilated.

Spleen: The spleen is unremarkable with probable small calcified
splenic artery aneurysm, relatively unchanged compared to the prior
CT.

Adrenals/Urinary Tract: The adrenal glands appear normal. The
kidneys enhance and the angiomyolipoma noted previously emanating
from the upper pole of the left kidney posterior medially has
increased in size, now measuring 15 mm in greatest diameter compared
to 10 mm previously. No suspicious features are evident by CT. No
renal calculi are noted and there is no evidence of hydronephrosis.
On delayed images the pelvocaliceal systems are unremarkable. The
ureters are normal in caliber. The urinary bladder is not well
distended but no gross abnormality is seen.

Stomach/Bowel: The stomach is not well distended but no abnormality
is evident. No small bowel abnormality is seen. The rectosigmoid
colon is noted to be tortuous and a somewhat elongated. Multiple
rectosigmoid colon diverticula are present diverticula also are
scattered throughout the remainder of the colon primarily in the
descending colon. The terminal ileum and the appendix are
unremarkable. There is mild fecalization of the distal ileum which
is nonspecific but can be seen with obstruction or delayed transit
through the small bowel.

Vascular/Lymphatic: Moderate abdominal aortic atherosclerosis is
present. No adenopathy is seen.

Reproductive: The uterus has been resected. No adnexal lesion is
seen. No fluid is noted within the pelvis.

Other: There is and anterior abdominal wall hernia located
approximately 7 cm above the umbilicus containing only peritoneal
fat. No bowel is seen to enter this in mid abdominal hernia.

Musculoskeletal: There is very slight anterolisthesis of L4 on L5 of
approximately 5 mm most likely due to degenerative change of the
facet joints of L4-5 and L5-S1. Mild degenerative disc disease is
noted at L4-5 as well.
IMPRESSION: 1. Little change to slight increase in peritoneal fat within the
midline abdominal wall hernia 7 cm above the umbilicus. No bowel
enters this abdominal wall hernia.
2. Increase in size of left upper pole renal angiomyolipoma compared
to a prior CT of 1114. No suspicious features are evident.
3. Mild fecalization of the distal ileum which is nonspecific but
can be seen with obstruction or delayed transit time.
4. Multiple rectosigmoid and descending colon diverticula.
5. Moderate abdominal aortic atherosclerosis.
6. Slight anterolisthesis of L4 on L5 with degenerative disc disease
at L4-5.

## 2020-05-08 IMAGING — CR DG CHEST 2V
2 series · 2 of 2 positions shown · non-contrast
Comparison: Chest x-ray of 02/19/2013

CLINICAL DATA: Preop for hernia repair

EXAM:
CHEST - 2 VIEW

[w chest pa]
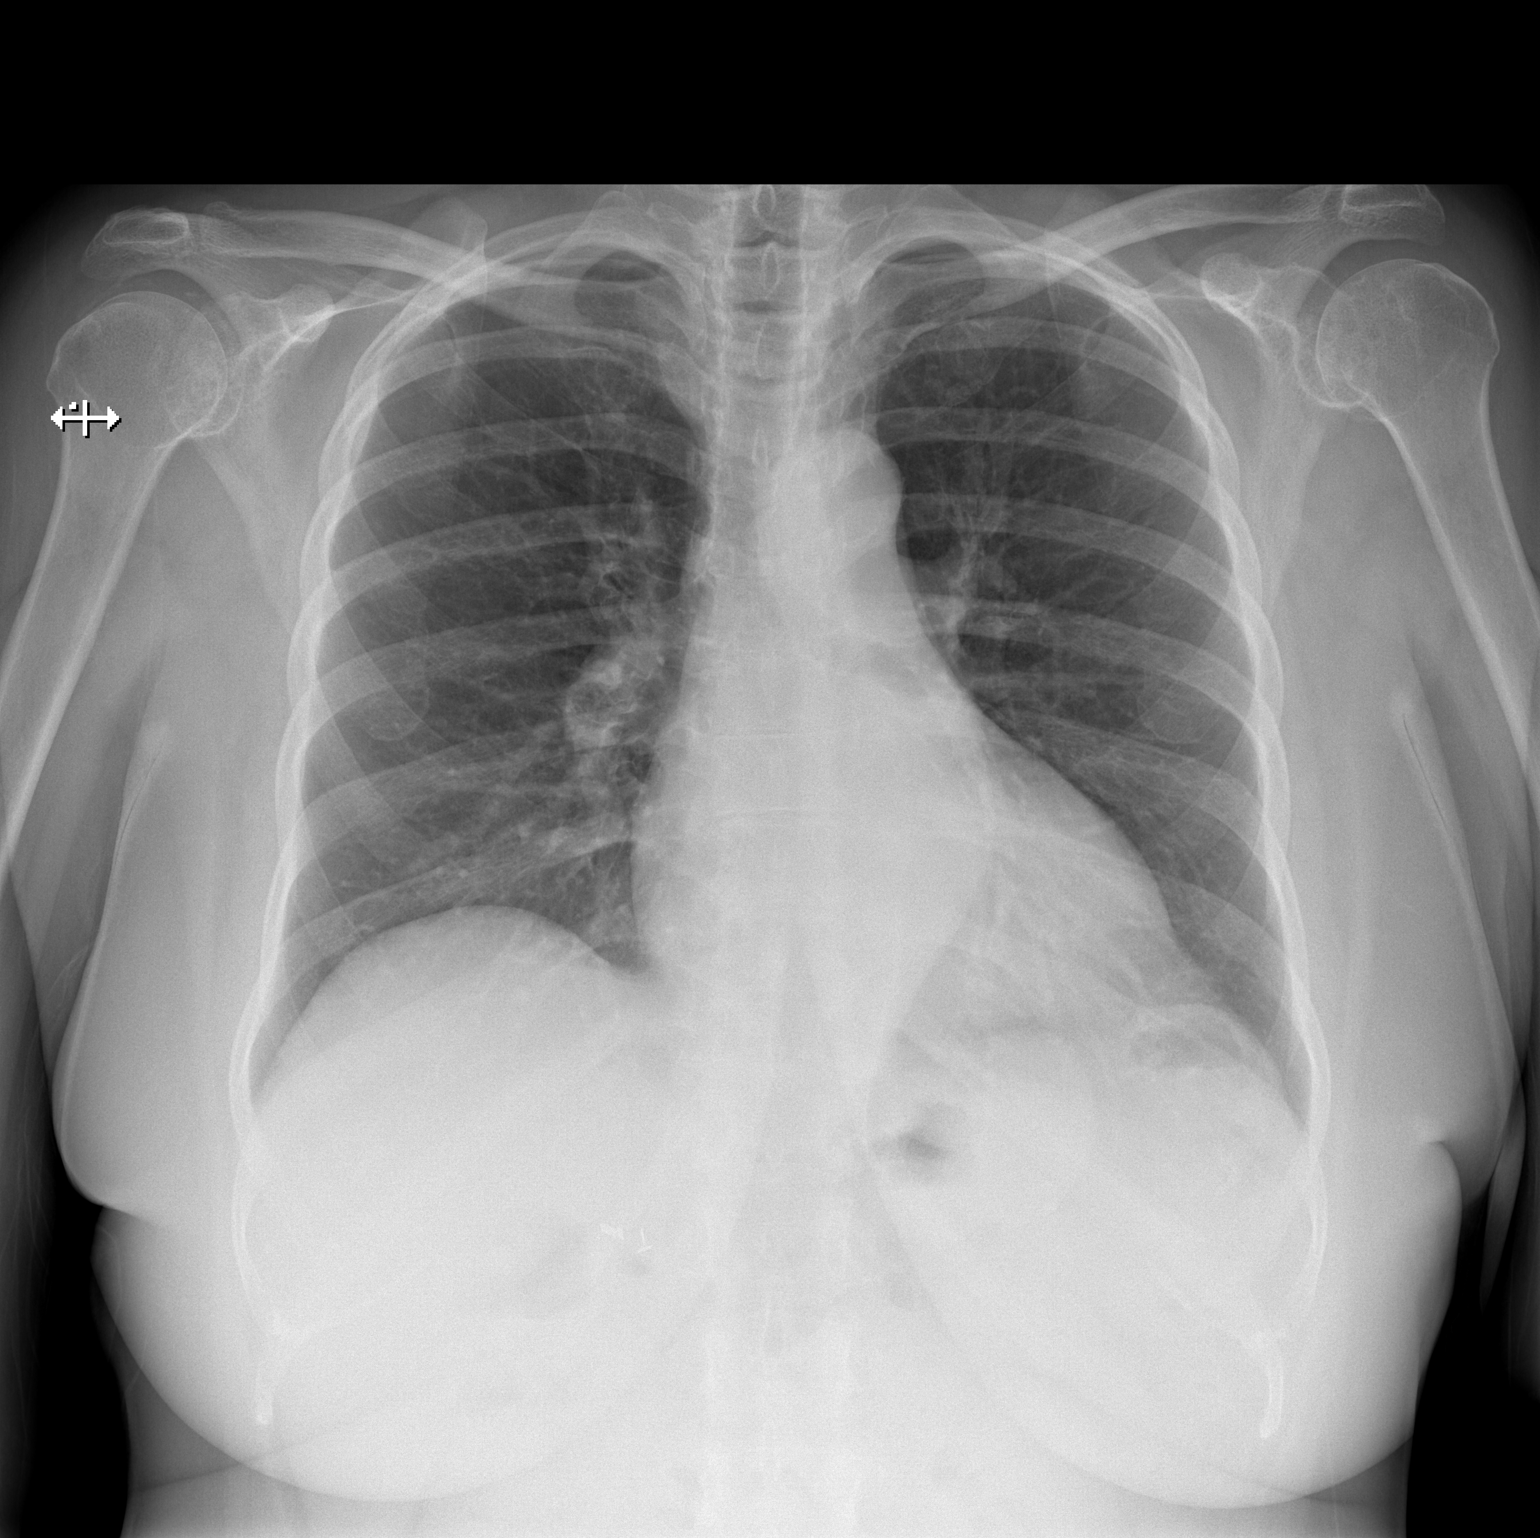

[w chest lat]
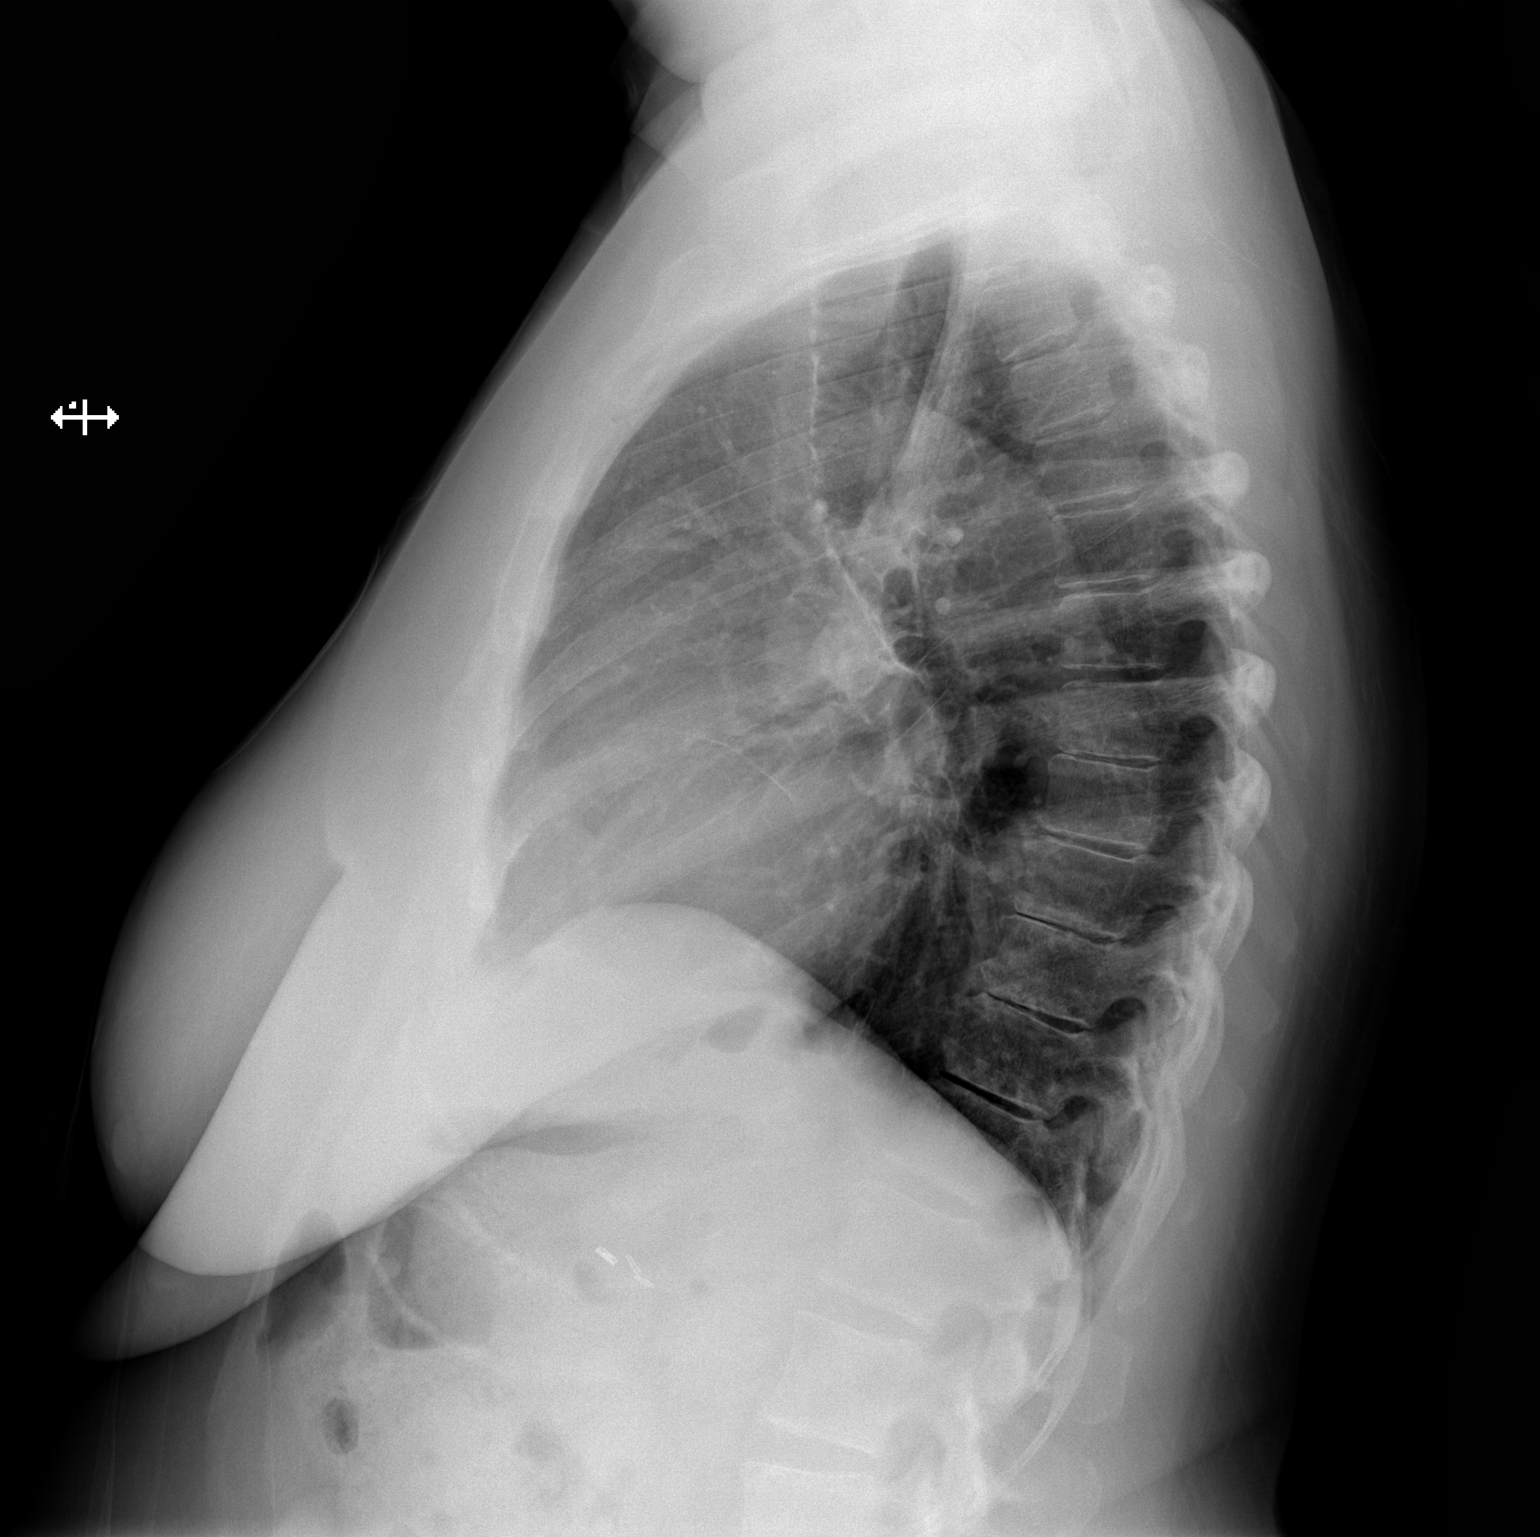

[2 of 2 positions shown; findings below may reference images not displayed]

FINDINGS: No active infiltrate or effusion is seen. Mediastinal and hilar
contours are unremarkable. The heart is within upper limits of
normal. No acute bony abnormality is seen. Surgical clips are
present in the right upper quadrant from prior cholecystectomy.
IMPRESSION: No active cardiopulmonary disease.

## 2020-10-20 ENCOUNTER — Other Ambulatory Visit: Payer: Self-pay | Admitting: Internal Medicine

## 2020-10-21 LAB — CBC
HCT: 40.7 % (ref 35.0–45.0)
Hemoglobin: 12.6 g/dL (ref 11.7–15.5)
MCH: 26.9 pg — ABNORMAL LOW (ref 27.0–33.0)
MCHC: 31 g/dL — ABNORMAL LOW (ref 32.0–36.0)
MCV: 86.8 fL (ref 80.0–100.0)
MPV: 9.7 fL (ref 7.5–12.5)
Platelets: 319 10*3/uL (ref 140–400)
RBC: 4.69 10*6/uL (ref 3.80–5.10)
RDW: 15.1 % — ABNORMAL HIGH (ref 11.0–15.0)
WBC: 6.8 10*3/uL (ref 3.8–10.8)

## 2020-10-21 LAB — COMPLETE METABOLIC PANEL WITH GFR
AG Ratio: 1.5 (calc) (ref 1.0–2.5)
ALT: 8 U/L (ref 6–29)
AST: 15 U/L (ref 10–35)
Albumin: 4.3 g/dL (ref 3.6–5.1)
Alkaline phosphatase (APISO): 64 U/L (ref 37–153)
BUN/Creatinine Ratio: 19 (calc) (ref 6–22)
BUN: 21 mg/dL (ref 7–25)
CO2: 25 mmol/L (ref 20–32)
Calcium: 10.4 mg/dL (ref 8.6–10.4)
Chloride: 105 mmol/L (ref 98–110)
Creat: 1.08 mg/dL — ABNORMAL HIGH (ref 0.60–0.93)
GFR, Est African American: 59 mL/min/{1.73_m2} — ABNORMAL LOW (ref >=60)
GFR, Est Non African American: 51 mL/min/{1.73_m2} — ABNORMAL LOW (ref >=60)
Globulin: 2.8 g/dL (calc) (ref 1.9–3.7)
Glucose, Bld: 101 mg/dL — ABNORMAL HIGH (ref 65–99)
Potassium: 3.6 mmol/L (ref 3.5–5.3)
Sodium: 142 mmol/L (ref 135–146)
Total Bilirubin: 0.3 mg/dL (ref 0.2–1.2)
Total Protein: 7.1 g/dL (ref 6.1–8.1)

## 2020-10-21 LAB — TSH: TSH: 2.17 mIU/L (ref 0.40–4.50)

## 2020-10-21 LAB — LIPID PANEL
Cholesterol: 139 mg/dL (ref <200)
HDL: 49 mg/dL — ABNORMAL LOW (ref >=50)
LDL Cholesterol (Calc): 72 mg/dL (calc)
Non-HDL Cholesterol (Calc): 90 mg/dL (calc) (ref <130)
Total CHOL/HDL Ratio: 2.8 (calc) (ref <5.0)
Triglycerides: 92 mg/dL (ref <150)

## 2020-10-25 ENCOUNTER — Other Ambulatory Visit: Payer: Self-pay | Admitting: Internal Medicine

## 2020-10-25 DIAGNOSIS — E2839 Other primary ovarian failure: Secondary | ICD-10-CM

## 2021-04-25 ENCOUNTER — Inpatient Hospital Stay: Admission: RE | Admit: 2021-04-25 | Payer: Medicare Other | Source: Ambulatory Visit

## 2022-04-12 ENCOUNTER — Other Ambulatory Visit: Payer: Self-pay | Admitting: Internal Medicine

## 2022-04-13 LAB — COMPLETE METABOLIC PANEL WITH GFR
AG Ratio: 1.6 (calc) (ref 1.0–2.5)
ALT: 8 U/L (ref 6–29)
AST: 14 U/L (ref 10–35)
Albumin: 4.5 g/dL (ref 3.6–5.1)
Alkaline phosphatase (APISO): 56 U/L (ref 37–153)
BUN/Creatinine Ratio: 19 (calc) (ref 6–22)
BUN: 19 mg/dL (ref 7–25)
CO2: 24 mmol/L (ref 20–32)
Calcium: 10.3 mg/dL (ref 8.6–10.4)
Chloride: 101 mmol/L (ref 98–110)
Creat: 1.02 mg/dL — ABNORMAL HIGH (ref 0.60–1.00)
Globulin: 2.9 g/dL (calc) (ref 1.9–3.7)
Glucose, Bld: 83 mg/dL (ref 65–99)
Potassium: 3.5 mmol/L (ref 3.5–5.3)
Sodium: 140 mmol/L (ref 135–146)
Total Bilirubin: 0.5 mg/dL (ref 0.2–1.2)
Total Protein: 7.4 g/dL (ref 6.1–8.1)
eGFR: 57 mL/min/{1.73_m2} — ABNORMAL LOW (ref >=60)

## 2022-04-13 LAB — CBC
HCT: 37.8 % (ref 35.0–45.0)
Hemoglobin: 12.4 g/dL (ref 11.7–15.5)
MCH: 29.2 pg (ref 27.0–33.0)
MCHC: 32.8 g/dL (ref 32.0–36.0)
MCV: 88.9 fL (ref 80.0–100.0)
MPV: 10.1 fL (ref 7.5–12.5)
Platelets: 319 10*3/uL (ref 140–400)
RBC: 4.25 10*6/uL (ref 3.80–5.10)
RDW: 13.8 % (ref 11.0–15.0)
WBC: 5 10*3/uL (ref 3.8–10.8)

## 2022-04-13 LAB — LIPID PANEL
Cholesterol: 150 mg/dL (ref <200)
HDL: 55 mg/dL (ref >=50)
LDL Cholesterol (Calc): 81 mg/dL (calc)
Non-HDL Cholesterol (Calc): 95 mg/dL (calc) (ref <130)
Total CHOL/HDL Ratio: 2.7 (calc) (ref <5.0)
Triglycerides: 64 mg/dL (ref <150)

## 2022-04-13 LAB — TSH: TSH: 2.15 mIU/L (ref 0.40–4.50)

## 2023-03-26 ENCOUNTER — Ambulatory Visit: Payer: 59 | Admitting: Family Medicine

## 2024-02-05 ENCOUNTER — Encounter (HOSPITAL_COMMUNITY): Payer: Self-pay

## 2024-02-05 ENCOUNTER — Emergency Department (HOSPITAL_COMMUNITY)
Admission: EM | Admit: 2024-02-05 | Discharge: 2024-02-05 | Disposition: A | Discharge status: Home / Self Care | Attending: Emergency Medicine | Admitting: Emergency Medicine

## 2024-02-05 ENCOUNTER — Emergency Department (HOSPITAL_COMMUNITY)

## 2024-02-05 ENCOUNTER — Other Ambulatory Visit: Payer: Self-pay

## 2024-02-05 DIAGNOSIS — R0789 Other chest pain: Secondary | ICD-10-CM | POA: Diagnosis present

## 2024-02-05 DIAGNOSIS — R1013 Epigastric pain: Secondary | ICD-10-CM | POA: Insufficient documentation

## 2024-02-05 DIAGNOSIS — R0602 Shortness of breath: Secondary | ICD-10-CM | POA: Insufficient documentation

## 2024-02-05 DIAGNOSIS — Z79899 Other long term (current) drug therapy: Secondary | ICD-10-CM | POA: Diagnosis not present

## 2024-02-05 DIAGNOSIS — Z7982 Long term (current) use of aspirin: Secondary | ICD-10-CM | POA: Diagnosis not present

## 2024-02-05 DIAGNOSIS — R791 Abnormal coagulation profile: Secondary | ICD-10-CM | POA: Diagnosis not present

## 2024-02-05 DIAGNOSIS — R000 Tachycardia, unspecified: Secondary | ICD-10-CM | POA: Insufficient documentation

## 2024-02-05 DIAGNOSIS — R079 Chest pain, unspecified: Secondary | ICD-10-CM

## 2024-02-05 LAB — CBC WITH DIFFERENTIAL/PLATELET
Abs Immature Granulocytes: 0.03 K/uL (ref 0.00–0.07)
Basophils Absolute: 0.1 K/uL (ref 0.0–0.1)
Basophils Relative: 1 %
Eosinophils Absolute: 0.3 K/uL (ref 0.0–0.5)
Eosinophils Relative: 5 %
HCT: 39.3 % (ref 36.0–46.0)
Hemoglobin: 12.5 g/dL (ref 12.0–15.0)
Immature Granulocytes: 1 %
Lymphocytes Relative: 34 %
Lymphs Abs: 2 K/uL (ref 0.7–4.0)
MCH: 29.1 pg (ref 26.0–34.0)
MCHC: 31.8 g/dL (ref 30.0–36.0)
MCV: 91.4 fL (ref 80.0–100.0)
Monocytes Absolute: 0.4 K/uL (ref 0.1–1.0)
Monocytes Relative: 7 %
Neutro Abs: 3.1 K/uL (ref 1.7–7.7)
Neutrophils Relative %: 52 %
Platelets: 257 K/uL (ref 150–400)
RBC: 4.3 MIL/uL (ref 3.87–5.11)
RDW: 14.2 % (ref 11.5–15.5)
WBC: 5.8 K/uL (ref 4.0–10.5)
nRBC: 0 % (ref 0.0–0.2)

## 2024-02-05 LAB — COMPREHENSIVE METABOLIC PANEL WITH GFR
ALT: 11 U/L (ref 0–44)
AST: 25 U/L (ref 15–41)
Albumin: 4 g/dL (ref 3.5–5.0)
Alkaline Phosphatase: 67 U/L (ref 38–126)
Anion gap: 16 — ABNORMAL HIGH (ref 5–15)
BUN: 25 mg/dL — ABNORMAL HIGH (ref 8–23)
CO2: 21 mmol/L — ABNORMAL LOW (ref 22–32)
Calcium: 9.8 mg/dL (ref 8.9–10.3)
Chloride: 102 mmol/L (ref 98–111)
Creatinine, Ser: 1.31 mg/dL — ABNORMAL HIGH (ref 0.44–1.00)
GFR, Estimated: 42 mL/min — ABNORMAL LOW (ref >60)
Glucose, Bld: 159 mg/dL — ABNORMAL HIGH (ref 70–99)
Potassium: 3.6 mmol/L (ref 3.5–5.1)
Sodium: 139 mmol/L (ref 135–145)
Total Bilirubin: 0.4 mg/dL (ref 0.0–1.2)
Total Protein: 7.2 g/dL (ref 6.5–8.1)

## 2024-02-05 LAB — TROPONIN I (HIGH SENSITIVITY)
Troponin I (High Sensitivity): 5 ng/L (ref <18)
Troponin I (High Sensitivity): 7 ng/L (ref <18)

## 2024-02-05 LAB — CBG MONITORING, ED: Glucose-Capillary: 88 mg/dL (ref 70–99)

## 2024-02-05 LAB — D-DIMER, QUANTITATIVE: D-Dimer, Quant: 2.49 ug{FEU}/mL — ABNORMAL HIGH (ref 0.00–0.50)

## 2024-02-05 MED ORDER — IOHEXOL 350 MG/ML SOLN
75.0000 mL | Freq: Once | INTRAVENOUS | Status: AC | PRN
  Administered 2024-02-05: 75 mL via INTRAVENOUS

## 2024-02-05 NOTE — ED Triage Notes (Signed)
 C/O chest pain that started this morning that radiated to right shoulder blade and SHOB.

## 2024-02-05 NOTE — ED Provider Notes (Signed)
 [place] EMERGENCY DEPARTMENT AT [facility] Provider Note   CSN: [id number] Arrival date & time: 02/05/24  1039     Patient presents with: Chest Pain   Alison [name] is a 78 y.o. female.    Chest Pain Patient developed epigastric/chest pain.  Lower chest.  Began acutely at home.  Started this morning, although feeling much better now.  States she felt her heart racing.  Also some shortness of breath and radiation to the right shoulder blade.  No nausea or vomiting.  No fevers.  No exertional pains.  States recently moved and has not really been able to walk in the neighborhood because her right knee has been bothering her.  States it will give out at times.     Prior to Admission medications   Medication Sig Start Date End Date Taking? Authorizing Provider  ACCU-CHEK SOFTCLIX LANCETS lancets Test blood sugar 2 times a day. Dx code: 250.00 03/05/13   Trixie File, MD  aspirin  EC 81 MG tablet Take 81 mg by mouth daily.    [provider]  Aspirin -Salicylamide-Caffeine (ARTHRITIS STRENGTH BC POWDER PO) Take by mouth.    [provider]  Biotin 1000 MCG tablet Take 1,000 mcg by mouth every evening.    [provider]  Blood Glucose Monitoring Suppl (ACCU-CHEK NANO SMARTVIEW) W/DEVICE KIT Test blood sugar twice daily as instructed. 06/01/13   Trixie File, MD  Cholecalciferol (VITAMIN D3) 5000 units CAPS Take 5,000 Units by mouth every evening.    [provider]  cyclobenzaprine  (FLEXERIL ) 5 MG tablet Take 2 tablets (10 mg total) by mouth 2 (two) times daily as needed for muscle spasms. 12/17/17   Wieters, Hallie C, PA-C  diclofenac Sodium (VOLTAREN) 1 % GEL Apply topically 4 (four) times daily.    [provider]  diphenhydrAMINE  (BENADRYL ) 25 MG tablet Take 1 tablet (25 mg total) by mouth every 6 (six) hours. Patient taking differently: Take 25 mg by mouth every 6 (six) hours as needed for allergies.  07/01/12   Saul Copping, MD  fluticasone (FLONASE) 50 MCG/ACT nasal spray Place 1 spray into both nostrils daily as needed for allergies.    [provider]  glucose blood (ACCU-CHEK SMARTVIEW) test strip Use to test blood sugar 2 times daily as instructed. Dx code: 250.00 06/03/13   Trixie File, MD  JANUVIA  100 MG tablet TAKE 1 TABLET (100 MG TOTAL) BY MOUTH DAILY. Patient taking differently: TAKE 1 TABLET (100 MG TOTAL) BY MOUTH DAILY AT NIGHT. 04/19/13   Trixie File, MD  lisinopril (PRINIVIL,ZESTRIL) 20 MG tablet Take 20 mg by mouth every evening. 08/09/17   [provider]  metFORMIN  (GLUCOPHAGE ) 500 MG tablet Take 500 mg by mouth 2 (two) times daily. 07/28/17   [provider]  Multiple Vitamin (MULTIVITAMIN WITH MINERALS) TABS tablet Take 1 tablet by mouth daily.    [provider]  Multiple Vitamins-Minerals (OCUVITE PO) Take 1 tablet by mouth daily.    [provider]  PARoxetine  (PAXIL ) 30 MG tablet Take 30 mg by mouth daily.    [provider]  simvastatin (ZOCOR) 10 MG tablet Take 10 mg by mouth at bedtime. 09/01/17   [provider]  tiZANidine (ZANAFLEX) 4 MG tablet Take 4 mg by mouth 2 (two) times daily as needed for spasms. 06/07/17   [provider]  vitamin E 200 UNIT capsule Take 200 Units by mouth daily.    [provider]    Allergies:  Codeine and Penicillins    Review of Systems  Cardiovascular:  Positive for chest pain.    Updated Vital Signs BP 123/62   Pulse 78   Temp 97.9 F (36.6 C) (Oral)   Resp 20   Ht 5' 3 (1.6 m)   Wt 77.6 kg   SpO2 100%   BMI 30.29 kg/m   Physical Exam Vitals and nursing note reviewed.  HENT:     Head: Atraumatic.  Cardiovascular:     Rate and Rhythm: Regular rhythm. Tachycardia present.  Pulmonary:     Breath sounds: No decreased breath sounds, wheezing or rhonchi.  Chest:     Chest wall: Tenderness present.     Comments: Mild lower chest tenderness.  No  rash. Abdominal:     Tenderness: There is no abdominal tenderness.  Musculoskeletal:     Right lower leg: No edema.     Left lower leg: No edema.  Skin:    General: Skin is warm.  Neurological:     Mental Status: She is alert.     (all labs ordered are listed, but only abnormal results are displayed) Labs Reviewed  COMPREHENSIVE METABOLIC PANEL WITH GFR - Abnormal; Notable for the following components:      Result Value   CO2 21 (*)    Glucose, Bld 159 (*)    BUN 25 (*)    Creatinine, Ser 1.31 (*)    GFR, Estimated 42 (*)    Anion gap 16 (*)    All other components within normal limits  D-DIMER, QUANTITATIVE - Abnormal; Notable for the following components:   D-Dimer, Quant 2.49 (*)    All other components within normal limits  CBC WITH DIFFERENTIAL/PLATELET  CBG MONITORING, ED  TROPONIN I (HIGH SENSITIVITY)  TROPONIN I (HIGH SENSITIVITY)    EKG: EKG Interpretation Date/Time:  Thursday February 05 2024 10:48:31 EDT Ventricular Rate:  103 PR Interval:  158 QRS Duration:  62 QT Interval:  333 QTC Calculation: 436 R Axis:   -46  Text Interpretation: Sinus tachycardia Left anterior fascicular block Probable anterior infarct, age indeterminate No significant change since last tracing Confirmed by Patsey Lot [phone]) on 02/05/2024 10:55:23 AM  Radiology: CT Angio Chest PE W and/or Wo Contrast Result Date: 02/05/2024 CLINICAL DATA:  Chest pain. EXAM: CT ANGIOGRAPHY CHEST WITH CONTRAST TECHNIQUE: Multidetector CT imaging of the chest was performed using the standard protocol during bolus administration of intravenous contrast. Multiplanar CT image reconstructions and MIPs were obtained to evaluate the vascular anatomy. RADIATION DOSE REDUCTION: This exam was performed according to the departmental dose-optimization program which includes automated exposure control, adjustment of the mA and/or kV according to patient size and/or use of iterative reconstruction technique.  CONTRAST:  75mL OMNIPAQUE IOHEXOL 350 MG/ML SOLN COMPARISON:  None Available. FINDINGS: Cardiovascular: There is mild calcification of the aortic arch. Satisfactory opacification of the pulmonary arteries to the segmental level. No evidence of pulmonary embolism. Normal heart size. No pericardial effusion. Mediastinum/Nodes: No enlarged mediastinal, hilar, or axillary lymph nodes. Thyroid gland, trachea, and esophagus demonstrate no significant findings. Lungs/Pleura: Mild atelectatic changes are seen within the bilateral lower lobes. No acute infiltrate, pleural effusion or pneumothorax is identified Upper Abdomen: Multiple surgical clips are seen within the gallbladder fossa. Musculoskeletal: Multilevel degenerative changes are seen throughout the thoracic spine. Review of the MIP images confirms the above findings. IMPRESSION: 1. No evidence of pulmonary embolism or other acute intrathoracic process. 2. Mild bilateral lower lobe atelectasis. 3. Evidence of prior cholecystectomy.  4. Aortic atherosclerosis. Electronically Signed   By: Suzen Dials M.D.   On: 02/05/2024 13:17   DG Chest Portable 1 View Result Date: 02/05/2024 CLINICAL DATA:  chest pain EXAM: PORTABLE CHEST - 1 VIEW COMPARISON:  Sep 11, 2017 FINDINGS: Elevation of the right hemidiaphragm. No focal airspace consolidation, pleural effusion, or pneumothorax. No cardiomegaly. Tortuous aorta with aortic atherosclerosis. No acute fracture or destructive lesions. Multilevel thoracic osteophytosis. Moderate osteoarthritis of both shoulders. Cholecystectomy clips. IMPRESSION: No acute cardiopulmonary abnormality. Electronically Signed   By: Rogelia Myers M.D.   On: 02/05/2024 11:18     Procedures   Medications Ordered in the ED  iohexol (OMNIPAQUE) 350 MG/ML injection 75 mL (75 mLs Intravenous Contrast Given 02/05/24 1232)                                    Medical Decision Making Amount and/or Complexity of Data Reviewed Labs:  ordered. Radiology: ordered.  Risk Prescription drug management.   Patient with chest pain.  Does have some radiation to shoulder.  Differential diagnosis does include arrhythmia since she felt her heart racing.  Also causes such as coronary artery disease, ACS.  Biliary disease felt less likely with previous cholecystectomy.  However with radiation to the shoulder will get some blood work.  Will get x-ray also.  EKG reassuring.  Troponins negative.  D-dimer however was elevated.  Had negative CTA.  Doubt cardiac ischemia.  Also previous cholecystectomy.  Nonspecific chest pain stable for discharge home.  Patient later states she has been having ringing in her ear since starting Remeron.  Ear exam reassuring.  Can follow-up with PCP as needed.      Final diagnoses:  Nonspecific chest pain    ED Discharge Orders     None          Patsey Lot, MD 02/06/24 [phone]

## 2024-04-17 ENCOUNTER — Emergency Department (HOSPITAL_COMMUNITY)
Admission: EM | Admit: 2024-04-17 | Discharge: 2024-04-18 | Attending: Emergency Medicine | Admitting: Emergency Medicine

## 2024-04-17 ENCOUNTER — Encounter (HOSPITAL_COMMUNITY): Payer: Self-pay | Admitting: *Deleted

## 2024-04-17 ENCOUNTER — Other Ambulatory Visit: Payer: Self-pay

## 2024-04-17 DIAGNOSIS — Z5321 Procedure and treatment not carried out due to patient leaving prior to being seen by health care provider: Secondary | ICD-10-CM | POA: Insufficient documentation

## 2024-04-17 DIAGNOSIS — F419 Anxiety disorder, unspecified: Secondary | ICD-10-CM | POA: Diagnosis present

## 2024-04-17 MED ORDER — ACETAMINOPHEN 325 MG PO TABS
650.0000 mg | ORAL_TABLET | Freq: Once | ORAL | Status: AC
  Administered 2024-04-17: 650 mg via ORAL
  Filled 2024-04-17: qty 2

## 2024-04-17 NOTE — ED Triage Notes (Signed)
 BIB EMS due to anxiety attack, pt has been out of her meds for over a week. Paxil  and Tizanidine, one more unsure of name. Lives with granddaughter who has ? Gone to pick up meds.
